# Patient Record
Sex: Female | Born: 1960 | Race: White | Hispanic: No | Marital: Single | Smoking: Never smoker
Health system: Southern US, Community
[De-identification: ages and names within clinical notes are randomized; demographics above are authoritative.]

## PROBLEM LIST (undated history)

## (undated) HISTORY — PX: BREAST BIOPSY: SHX20

---

## 1988-02-20 ENCOUNTER — Encounter: Payer: Self-pay | Admitting: Cardiology

## 1995-07-29 ENCOUNTER — Encounter: Payer: Self-pay | Admitting: Cardiology

## 1998-09-30 ENCOUNTER — Other Ambulatory Visit: Admission: RE | Admit: 1998-09-30 | Discharge: 1998-09-30 | Payer: Self-pay | Admitting: Obstetrics & Gynecology

## 1998-10-21 ENCOUNTER — Encounter: Payer: Self-pay | Admitting: Obstetrics & Gynecology

## 1998-10-21 ENCOUNTER — Ambulatory Visit (HOSPITAL_COMMUNITY): Admission: RE | Admit: 1998-10-21 | Discharge: 1998-10-21 | Payer: Self-pay | Admitting: Obstetrics & Gynecology

## 1998-11-04 ENCOUNTER — Encounter: Payer: Self-pay | Admitting: Obstetrics & Gynecology

## 1998-11-04 ENCOUNTER — Ambulatory Visit (HOSPITAL_COMMUNITY): Admission: RE | Admit: 1998-11-04 | Discharge: 1998-11-04 | Payer: Self-pay | Admitting: Obstetrics & Gynecology

## 1999-12-15 ENCOUNTER — Other Ambulatory Visit: Admission: RE | Admit: 1999-12-15 | Discharge: 1999-12-15 | Payer: Self-pay | Admitting: Obstetrics & Gynecology

## 2000-08-30 ENCOUNTER — Ambulatory Visit (HOSPITAL_COMMUNITY): Admission: RE | Admit: 2000-08-30 | Discharge: 2000-08-30 | Payer: Self-pay | Admitting: Obstetrics & Gynecology

## 2000-08-30 ENCOUNTER — Encounter: Payer: Self-pay | Admitting: Obstetrics & Gynecology

## 2003-01-08 ENCOUNTER — Other Ambulatory Visit: Admission: RE | Admit: 2003-01-08 | Discharge: 2003-01-08 | Payer: Self-pay | Admitting: Obstetrics & Gynecology

## 2005-07-26 ENCOUNTER — Other Ambulatory Visit: Admission: RE | Admit: 2005-07-26 | Discharge: 2005-07-26 | Payer: Self-pay | Admitting: Obstetrics & Gynecology

## 2007-06-12 ENCOUNTER — Encounter: Admission: RE | Admit: 2007-06-12 | Discharge: 2007-06-12 | Payer: Self-pay | Admitting: Internal Medicine

## 2009-10-14 ENCOUNTER — Emergency Department (HOSPITAL_COMMUNITY): Admission: EM | Admit: 2009-10-14 | Discharge: 2009-10-14 | Payer: Self-pay | Admitting: Emergency Medicine

## 2009-10-14 ENCOUNTER — Encounter: Payer: Self-pay | Admitting: Cardiology

## 2009-10-21 ENCOUNTER — Ambulatory Visit: Payer: Self-pay | Admitting: Internal Medicine

## 2009-11-04 ENCOUNTER — Ambulatory Visit: Payer: Self-pay | Admitting: Internal Medicine

## 2010-02-17 ENCOUNTER — Ambulatory Visit: Payer: Self-pay | Admitting: Internal Medicine

## 2010-08-31 ENCOUNTER — Ambulatory Visit: Payer: Self-pay | Admitting: Internal Medicine

## 2010-09-14 ENCOUNTER — Encounter: Payer: Self-pay | Admitting: Cardiology

## 2010-09-22 ENCOUNTER — Ambulatory Visit: Payer: Self-pay | Admitting: Internal Medicine

## 2010-09-25 ENCOUNTER — Encounter: Payer: Self-pay | Admitting: Cardiology

## 2010-10-11 ENCOUNTER — Encounter: Payer: Self-pay | Admitting: Cardiology

## 2010-10-11 ENCOUNTER — Ambulatory Visit: Payer: Self-pay | Admitting: Cardiology

## 2010-10-11 DIAGNOSIS — R002 Palpitations: Secondary | ICD-10-CM

## 2010-10-11 DIAGNOSIS — I499 Cardiac arrhythmia, unspecified: Secondary | ICD-10-CM | POA: Insufficient documentation

## 2010-10-27 ENCOUNTER — Ambulatory Visit: Payer: Self-pay

## 2010-10-27 ENCOUNTER — Ambulatory Visit (HOSPITAL_COMMUNITY)
Admission: RE | Admit: 2010-10-27 | Discharge: 2010-10-27 | Payer: Self-pay | Source: Home / Self Care | Attending: Cardiology | Admitting: Cardiology

## 2010-10-27 ENCOUNTER — Ambulatory Visit: Payer: Self-pay | Admitting: Cardiology

## 2010-10-27 ENCOUNTER — Encounter: Payer: Self-pay | Admitting: Cardiology

## 2010-10-30 ENCOUNTER — Encounter: Payer: Self-pay | Admitting: Cardiology

## 2010-11-03 ENCOUNTER — Ambulatory Visit: Payer: Self-pay | Admitting: Cardiology

## 2010-11-30 ENCOUNTER — Ambulatory Visit: Admit: 2010-11-30 | Payer: Self-pay | Admitting: Internal Medicine

## 2010-12-01 ENCOUNTER — Ambulatory Visit: Admit: 2010-12-01 | Payer: Self-pay | Admitting: Internal Medicine

## 2010-12-21 NOTE — Procedures (Signed)
Summary: SUMMARY REPORT  SUMMARY REPORT   Imported By: Mirna Mires 11/03/2010 14:36:51  _____________________________________________________________________  External Attachment:    Type:   Image     Comment:   External Document

## 2010-12-21 NOTE — Assessment & Plan Note (Signed)
Summary: PER CHECK OUT/ F/U TO MONITOR AND ECHO/SAF   Visit Type:  rov Primary Provider:  Dr. Lenord Fellers  CC:  no complaints.  History of Present Illness: 50 yo with history of HTN returns for evaluation of palpitations.  She says that her palpitations have mostly resolved.  She is exercising more, has lost weight, and has cut back on caffeine.  48 hour holter showed only rare PACs and echo showed a structurally normal heart.   ECG: NSR, rSR' pattern in V1 without significant conduction delay, poor anterior R wave progression (ECG in 11/10 was similar)  Labs (12/10): LDL 107, HDL 57, TSH normal  Current Medications (verified): 1)  Hydrochlorothiazide 25 Mg Tabs (Hydrochlorothiazide) .... Take One Daily 2)  Cozaar 50 Mg Tabs (Losartan Potassium) .... Take One Daily 3)  Zoloft 50 Mg Tabs (Sertraline Hcl) .... Take One Daily 4)  Biotin 1000 Mcg Tabs (Biotin) .... Take One Daily 5)  Daily-Vitamin  Tabs (Multiple Vitamin) .... Take One Daily 6)  Vitamin D3 5000 Unit/ml Liqd (Cholecalciferol) .Marland Kitchen.. 1 Tablet By Mouth Once Daily  Allergies (verified): No Known Drug Allergies  Past History:  Past Medical History: 1. HTN  2. Impaired fasting glucose 3. h/o positional vertigo 4. Anxiety 5. Palpitations: 48 hour holter (11/11) with occasional PACs only.  Echo (12/11): EF 60-65%, normal valves, normal RV.   Family History: Reviewed history from 10/11/2010 and no changes required. Grandfather with 3 MIs Grandfather with CHF  Social History: Reviewed history from 10/11/2010 and no changes required. Armed forces operational officer, former Engineer, production, nonsmoker  Vital Signs:  Patient profile:   50 year old female Height:      60.5 inches Weight:      158 pounds BMI:     30.46 Pulse (ortho):   88 / minute BP sitting:   114 / 79  (left arm) Cuff size:   regular  Vitals Entered By: Caralee Ates CMA (November 03, 2010 9:27 AM)  Physical Exam  General:  Well developed, well nourished,  in no acute distress. Neck:  Neck supple, no JVD. No masses, thyromegaly or abnormal cervical nodes. Lungs:  Clear bilaterally to auscultation and percussion. Heart:  Non-displaced PMI, chest non-tender; regular rate and rhythm, S1, S2 without murmurs, rubs. +S4. Carotid upstroke normal, no bruit. Pedals normal pulses. No edema, no varicosities. Abdomen:  Bowel sounds positive; abdomen soft and non-tender without masses, organomegaly, or hernias noted. No hepatosplenomegaly. Extremities:  No clubbing or cyanosis. Neurologic:  Alert and oriented x 3. Psych:  Normal affect.   Impression & Recommendations:  Problem # 1:  PALPITATIONS (ICD-785.1) Occasional PACs only on holter, no concerning arrhythmias.  Heart structurally normal on echo.  I suspect that her palpitations were due to PACs.  Symptoms are improved with decreased caffeine, increased exercise, and less anxiety while on Zoloft.  No further workup necessary.   Patient Instructions: 1)  Your physician recommends that you schedule a follow-up appointment as needed with Dr Shirlee Latch.

## 2010-12-21 NOTE — Assessment & Plan Note (Signed)
Summary: np6/. elevated heart rate. pt has bcbs. gd   Visit Type:  Initial Consult Primary Provider:  Dr. Lenord Fellers  CC:  concerns.  History of Present Illness: 50 yo with history of HTN presents for evaluation of palpitations.  She has been feeling irregular beats and fluttering in her chest starting around last December.  She feels them most at night but also when she gets "stressed."  Occasionally she gets episodes where her heart will race.  She gets very anxious when she has palpitations and has started on Zoloft, which really seems to have lessened the palpitations. She has good exercise tolerance.  She rides a stationary bike, uses an elliptical and treadmill with no exertional dyspnea or chest pain. She drinks 2 cups of coffee in the morning.    ECG: NSR, rSR' pattern in V1 without significant conduction delay, poor anterior R wave progression (ECG in 11/10 was similar)  Labs (12/10): LDL 107, HDL 57, TSH normal  Current Medications (verified): 1)  Hydrochlorothiazide 25 Mg Tabs (Hydrochlorothiazide) .... Take One Daily 2)  Cozaar 50 Mg Tabs (Losartan Potassium) .... Take One Daily 3)  Zoloft 50 Mg Tabs (Sertraline Hcl) .... Take One Daily 4)  Biotin 1000 Mcg Tabs (Biotin) .... Take One Daily 5)  Daily-Vitamin  Tabs (Multiple Vitamin) .... Take One Daily  Past History:  Past Medical History: 1. HTN  2. Impaired fasting glucose 3. h/o positional vertigo 4. Anxiety 5. Palpitations  Family History: Grandfather with 3 MIs Grandfather with CHF  Social History: Armed forces operational officer, former Engineer, production, nonsmoker  Review of Systems       All systems reviewed and negative except as per HPI.   Vital Signs:  Patient profile:   50 year old female Height:      60.5 inches Weight:      156 pounds BMI:     30.07 Pulse rate:   68 / minute Pulse rhythm:   regular BP sitting:   120 / 80  (right arm)  Vitals Entered By: Jacquelin Hawking, CMA (October 11, 2010 10:31  AM)  Physical Exam  General:  Well developed, well nourished, in no acute distress. Head:  normocephalic and atraumatic Nose:  no deformity, discharge, inflammation, or lesions Mouth:  Teeth, gums and palate normal. Oral mucosa normal. Neck:  Neck supple, no JVD. No masses, thyromegaly or abnormal cervical nodes. Lungs:  Clear bilaterally to auscultation and percussion. Heart:  Non-displaced PMI, chest non-tender; regular rate and rhythm, S1, S2 without murmurs, rubs. +S4. Carotid upstroke normal, no bruit. Pedals normal pulses. No edema, no varicosities. Abdomen:  Bowel sounds positive; abdomen soft and non-tender without masses, organomegaly, or hernias noted. No hepatosplenomegaly. Extremities:  No clubbing or cyanosis. Neurologic:  Alert and oriented x 3. Skin:  Intact without lesions or rashes. Psych:  Normal affect.   Impression & Recommendations:  Problem # 1:  PALPITATIONS (ICD-785.1) Patient has palpitations that likely represent PACs or PVCs.  She should cut back on caffeine.  Anxiety seems to be a significant driver for her palpitations, and they have considerably improved since starting on Zoloft.  She has good exercise tolerance.  I will get a 48 hour holter to confirm premature beats and an echo to confirm that her heart is structurally normal.  Her ECG is stable, and I do not think that it is significantly abnormal.   Other Orders: EKG w/ Interpretation (93000) Echocardiogram (Echo) Holter Monitor (Holter Monitor)  Patient Instructions: 1)  Your physician has  requested that you have an echocardiogram.  Echocardiography is a painless test that uses sound waves to create images of your heart. It provides your doctor with information about the size and shape of your heart and how well your heart's chambers and valves are working.  This procedure takes approximately one hour. There are no restrictions for this procedure. 2)  Your physician has recommended that you wear a  holter monitor.  Holter monitors are medical devices that record the heart's electrical activity. Doctors most often use these monitors to diagnose arrhythmias. Arrhythmias are problems with the speed or rhythm of the heartbeat. The monitor is a small, portable device. You can wear one while you do your normal daily activities. This is usually used to diagnose what is causing palpitations/syncope (passing out). 48 hour 3)  Your physician recommends that you schedule a follow-up appointment in: 2 weeks with Dr Shirlee Latch

## 2010-12-21 NOTE — Consult Note (Signed)
Summary: [facility] Medical Referral Form   [facility] Medical Referral Form   Imported By: Roderic Ovens 11/08/2010 16:10:18  _____________________________________________________________________  External Attachment:    Type:   Image     Comment:   External Document

## 2011-02-21 LAB — DIFFERENTIAL
Lymphocytes Relative: 18 % (ref 12–46)
Monocytes Absolute: 0.3 10*3/uL (ref 0.1–1.0)
Monocytes Relative: 4 % (ref 3–12)
Neutro Abs: 5.1 10*3/uL (ref 1.7–7.7)

## 2011-02-21 LAB — CBC
HCT: 42.9 % (ref 36.0–46.0)
Hemoglobin: 14.8 g/dL (ref 12.0–15.0)
MCHC: 34.5 g/dL (ref 30.0–36.0)
RBC: 5.03 MIL/uL (ref 3.87–5.11)

## 2011-02-21 LAB — BASIC METABOLIC PANEL
CO2: 26 mEq/L (ref 19–32)
Calcium: 9 mg/dL (ref 8.4–10.5)
GFR calc Af Amer: >60 mL/min (ref >60)
GFR calc non Af Amer: >60 mL/min (ref >60)
Potassium: 4 mEq/L (ref 3.5–5.1)
Sodium: 139 mEq/L (ref 135–145)

## 2011-02-21 LAB — POCT CARDIAC MARKERS
CKMB, poc: <1 ng/mL — ABNORMAL LOW (ref 1.0–8.0)
Troponin i, poc: <0.05 ng/mL (ref 0.00–0.09)

## 2011-03-08 ENCOUNTER — Ambulatory Visit: Payer: Self-pay | Admitting: Internal Medicine

## 2011-08-07 ENCOUNTER — Other Ambulatory Visit: Payer: Self-pay | Admitting: Internal Medicine

## 2011-08-08 ENCOUNTER — Other Ambulatory Visit: Payer: Self-pay

## 2011-08-08 MED ORDER — LOSARTAN POTASSIUM 50 MG PO TABS: 50.0000 mg | ORAL_TABLET | Freq: Every day | ORAL | Status: DC

## 2011-10-18 ENCOUNTER — Telehealth: Payer: Self-pay | Admitting: Internal Medicine

## 2011-10-18 MED ORDER — HYDROCHLOROTHIAZIDE 25 MG PO TABS: 25.0000 mg | ORAL_TABLET | Freq: Every day | ORAL | Status: DC

## 2011-10-18 NOTE — Telephone Encounter (Signed)
Check chart please re: no recent office visit. Can do 30 days and book OV

## 2011-10-18 NOTE — Telephone Encounter (Signed)
Called patient and left voicemail.  HCTZ 25 mg #30 phoned in with no refills.  Advised patient that she needs to schedule an appt with Dr. Lenord Fellers.  She has not been seen since January 2011

## 2011-10-31 ENCOUNTER — Other Ambulatory Visit: Payer: Self-pay | Admitting: Obstetrics & Gynecology

## 2011-10-31 DIAGNOSIS — N63 Unspecified lump in unspecified breast: Secondary | ICD-10-CM

## 2011-11-15 ENCOUNTER — Ambulatory Visit
Admission: RE | Admit: 2011-11-15 | Discharge: 2011-11-15 | Disposition: A | Discharge status: Home / Self Care | Payer: BC Managed Care – PPO | Source: Ambulatory Visit | Attending: Obstetrics & Gynecology | Admitting: Obstetrics & Gynecology

## 2011-11-15 DIAGNOSIS — N63 Unspecified lump in unspecified breast: Secondary | ICD-10-CM

## 2011-11-19 ENCOUNTER — Other Ambulatory Visit: Payer: Self-pay | Admitting: Internal Medicine

## 2012-01-11 ENCOUNTER — Ambulatory Visit (INDEPENDENT_AMBULATORY_CARE_PROVIDER_SITE_OTHER): Payer: BC Managed Care – PPO | Admitting: Internal Medicine

## 2012-01-11 ENCOUNTER — Encounter: Payer: Self-pay | Admitting: Internal Medicine

## 2012-01-11 VITALS — BP 114/84 | HR 80 | Temp 97.9°F | Ht 60.5 in | Wt 157.0 lb

## 2012-01-11 DIAGNOSIS — I10 Essential (primary) hypertension: Secondary | ICD-10-CM

## 2012-01-11 DIAGNOSIS — E119 Type 2 diabetes mellitus without complications: Secondary | ICD-10-CM

## 2012-01-11 DIAGNOSIS — Z23 Encounter for immunization: Secondary | ICD-10-CM

## 2012-01-11 DIAGNOSIS — F419 Anxiety disorder, unspecified: Secondary | ICD-10-CM

## 2012-01-11 DIAGNOSIS — Z0000 Encounter for general adult medical examination without abnormal findings: Secondary | ICD-10-CM

## 2012-01-11 DIAGNOSIS — R7302 Impaired glucose tolerance (oral): Secondary | ICD-10-CM

## 2012-01-11 LAB — POCT URINALYSIS DIPSTICK
Glucose, UA: NEGATIVE
Leukocytes, UA: NEGATIVE
Nitrite, UA: NEGATIVE
Urobilinogen, UA: NEGATIVE

## 2012-01-12 LAB — HEMOGLOBIN A1C: Hgb A1c MFr Bld: 5.6 % (ref <5.7)

## 2012-01-12 LAB — CBC WITH DIFFERENTIAL/PLATELET
Eosinophils Absolute: 0.1 10*3/uL (ref 0.0–0.7)
Lymphocytes Relative: 30 % (ref 12–46)
Lymphs Abs: 1.7 10*3/uL (ref 0.7–4.0)
Neutrophils Relative %: 62 % (ref 43–77)
Platelets: 300 10*3/uL (ref 150–400)
RBC: 5.33 MIL/uL — ABNORMAL HIGH (ref 3.87–5.11)
WBC: 5.8 10*3/uL (ref 4.0–10.5)

## 2012-01-12 LAB — LIPID PANEL
Cholesterol: 219 mg/dL — ABNORMAL HIGH (ref 0–200)
LDL Cholesterol: 138 mg/dL — ABNORMAL HIGH (ref 0–99)
VLDL: 21 mg/dL (ref 0–40)

## 2012-01-12 LAB — COMPREHENSIVE METABOLIC PANEL
ALT: 14 U/L (ref 0–35)
CO2: 27 mEq/L (ref 19–32)
Sodium: 142 mEq/L (ref 135–145)
Total Bilirubin: 0.4 mg/dL (ref 0.3–1.2)
Total Protein: 6.4 g/dL (ref 6.0–8.3)

## 2012-01-12 LAB — VITAMIN D 25 HYDROXY (VIT D DEFICIENCY, FRACTURES): Vit D, 25-Hydroxy: 46 ng/mL (ref 30–89)

## 2012-01-12 LAB — TSH: TSH: 2.059 u[IU]/mL (ref 0.350–4.500)

## 2012-02-01 ENCOUNTER — Other Ambulatory Visit: Payer: Self-pay | Admitting: Internal Medicine

## 2012-02-05 ENCOUNTER — Telehealth: Payer: Self-pay | Admitting: Internal Medicine

## 2012-02-05 MED ORDER — LOSARTAN POTASSIUM 50 MG PO TABS: 50.0000 mg | ORAL_TABLET | Freq: Every day | ORAL | Status: DC

## 2012-02-05 NOTE — Telephone Encounter (Signed)
Refill for Losartan sent to [facility]

## 2012-02-18 DIAGNOSIS — R7302 Impaired glucose tolerance (oral): Secondary | ICD-10-CM | POA: Insufficient documentation

## 2012-02-18 DIAGNOSIS — I10 Essential (primary) hypertension: Secondary | ICD-10-CM | POA: Insufficient documentation

## 2012-02-18 DIAGNOSIS — F419 Anxiety disorder, unspecified: Secondary | ICD-10-CM | POA: Insufficient documentation

## 2012-02-18 NOTE — Patient Instructions (Addendum)
Continue same medications and return in one year per patient request instead of 6 months

## 2012-02-18 NOTE — Progress Notes (Signed)
  Subjective:    Patient ID: Katherine Nelson, female    DOB: 02/03/1961, 51 y.o.   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 She is single and has never married. 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 Dr. Jennette Kettle is GYN physician and he  has her on estrogen replacement for menopause. History of hypertension and impaired glucose tolerance. Had vasovagal syncope 1996. Urinary tract infection 1999. Dr. Jennette Kettle does mammography. Took hepatitis B series in 1992. She has seen Dr. Juleen China in the past. He put her on Zoloft 50 mg daily and January 2012 for anxiety and stress at work. She saw Dr. Jearld Pies, cardiologist for evaluation for palpitations. He did a 2-D echocardiogram in 48 hour Holter monitor which did not reveal anything. She is on Cozaar 50 mg daily for hypertension. She also takes HCTZ 25 mg daily. Dr. Jearld Pies agree with his medications. She had an episode in 2012 for she had some haziness in her left eye centrally. 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 Patient does occasionally get headaches but doesn't describe them as migrainous. History of vitamin D deficiency.    Review of Systems history of anxiety, worries a lot about her health. Family history: Mom with history of lung cancer doing well.     Objective:   Physical Exam HEENT exam: Funduscopic exam is benign, TMs are clear, pharynx is clear, neck is supple without JVD thyromegaly or carotid bruits, chest clear to auscultation, breasts normal female without masses, cardiac exam regular rate and rhythm normal S1 and S2, abdomen no hepatosplenomegaly masses or tenderness, extremities without edema, pulses in feet are normal, no ulcers on feet        Assessment & Plan:  Hypertension  History of vitamin D deficiency  Anxiety  Impaired glucose tolerance  Plan: Return one year or as needed. Continue to watch diet. Try to lose a  bit of weight.

## 2012-12-05 ENCOUNTER — Other Ambulatory Visit: Payer: Self-pay | Admitting: Obstetrics & Gynecology

## 2012-12-05 DIAGNOSIS — Z1231 Encounter for screening mammogram for malignant neoplasm of breast: Secondary | ICD-10-CM

## 2012-12-25 ENCOUNTER — Ambulatory Visit
Admission: RE | Admit: 2012-12-25 | Discharge: 2012-12-25 | Disposition: A | Discharge status: Home / Self Care | Payer: BC Managed Care – PPO | Source: Ambulatory Visit | Attending: Obstetrics & Gynecology | Admitting: Obstetrics & Gynecology

## 2012-12-25 DIAGNOSIS — Z1231 Encounter for screening mammogram for malignant neoplasm of breast: Secondary | ICD-10-CM

## 2013-03-15 ENCOUNTER — Other Ambulatory Visit: Payer: Self-pay | Admitting: Internal Medicine

## 2013-03-15 NOTE — Telephone Encounter (Signed)
Refill refused - needs appt

## 2013-03-16 ENCOUNTER — Telehealth: Payer: Self-pay | Admitting: Internal Medicine

## 2013-03-16 MED ORDER — LOSARTAN POTASSIUM 50 MG PO TABS: 50.0000 mg | ORAL_TABLET | Freq: Every day | ORAL | Status: DC

## 2013-03-16 MED ORDER — HYDROCHLOROTHIAZIDE 25 MG PO TABS: ORAL_TABLET | ORAL | Status: DC

## 2013-03-16 NOTE — Telephone Encounter (Signed)
Patient says she has to have colonoscopy and GYN procedure done in the near future. And realizes she needs to come in for office visit/physical exam. We agreed that we will refill medication for 90 days. In the meantime she will call us to set up an appointment to be seen in the next couple of months. Given #90 of diuretic with no refill and #90 losartan 50 mg with no refill

## 2013-03-17 ENCOUNTER — Other Ambulatory Visit: Payer: Self-pay | Admitting: Internal Medicine

## 2013-03-19 NOTE — Telephone Encounter (Signed)
Left message for patient to call.

## 2013-06-19 ENCOUNTER — Other Ambulatory Visit: Payer: Self-pay | Admitting: Internal Medicine

## 2013-06-23 ENCOUNTER — Other Ambulatory Visit: Payer: Self-pay | Admitting: Internal Medicine

## 2013-06-23 NOTE — Telephone Encounter (Signed)
Has CPE appt Sept 19. Please refill for 30 days with one refill

## 2013-06-24 ENCOUNTER — Other Ambulatory Visit: Payer: Self-pay

## 2013-06-24 MED ORDER — LOSARTAN POTASSIUM 50 MG PO TABS: 50.0000 mg | ORAL_TABLET | Freq: Every day | ORAL | Status: DC

## 2013-06-24 MED ORDER — HYDROCHLOROTHIAZIDE 25 MG PO TABS: ORAL_TABLET | ORAL | Status: DC

## 2013-08-07 ENCOUNTER — Ambulatory Visit (INDEPENDENT_AMBULATORY_CARE_PROVIDER_SITE_OTHER): Payer: BC Managed Care – PPO | Admitting: Internal Medicine

## 2013-08-07 ENCOUNTER — Encounter: Payer: Self-pay | Admitting: Internal Medicine

## 2013-08-07 ENCOUNTER — Other Ambulatory Visit: Payer: BC Managed Care – PPO | Admitting: Internal Medicine

## 2013-08-07 VITALS — BP 116/74 | HR 80 | Ht 61.0 in | Wt 159.0 lb

## 2013-08-07 DIAGNOSIS — E7800 Pure hypercholesterolemia, unspecified: Secondary | ICD-10-CM

## 2013-08-07 DIAGNOSIS — Z13220 Encounter for screening for lipoid disorders: Secondary | ICD-10-CM

## 2013-08-07 DIAGNOSIS — Z130 Encounter for screening for diseases of the blood and blood-forming organs and certain disorders involving the immune mechanism: Secondary | ICD-10-CM

## 2013-08-07 DIAGNOSIS — R7309 Other abnormal glucose: Secondary | ICD-10-CM

## 2013-08-07 DIAGNOSIS — Z1329 Encounter for screening for other suspected endocrine disorder: Secondary | ICD-10-CM

## 2013-08-07 DIAGNOSIS — Z0000 Encounter for general adult medical examination without abnormal findings: Secondary | ICD-10-CM

## 2013-08-07 DIAGNOSIS — R7301 Impaired fasting glucose: Secondary | ICD-10-CM

## 2013-08-07 DIAGNOSIS — R7302 Impaired glucose tolerance (oral): Secondary | ICD-10-CM

## 2013-08-07 DIAGNOSIS — J309 Allergic rhinitis, unspecified: Secondary | ICD-10-CM

## 2013-08-07 DIAGNOSIS — I10 Essential (primary) hypertension: Secondary | ICD-10-CM

## 2013-08-07 LAB — POCT URINALYSIS DIPSTICK
Ketones, UA: NEGATIVE
Protein, UA: NEGATIVE
Spec Grav, UA: 1.02
pH, UA: 6

## 2013-08-07 LAB — CBC WITH DIFFERENTIAL/PLATELET
Basophils Absolute: 0 10*3/uL (ref 0.0–0.1)
Basophils Relative: 1 % (ref 0–1)
Eosinophils Absolute: 0.1 10*3/uL (ref 0.0–0.7)
MCH: 28.8 pg (ref 26.0–34.0)
MCHC: 34.6 g/dL (ref 30.0–36.0)
Neutrophils Relative %: 64 % (ref 43–77)
Platelets: 307 10*3/uL (ref 150–400)
RBC: 5.07 MIL/uL (ref 3.87–5.11)
RDW: 13.9 % (ref 11.5–15.5)

## 2013-08-07 LAB — COMPREHENSIVE METABOLIC PANEL
ALT: 15 U/L (ref 0–35)
Alkaline Phosphatase: 75 U/L (ref 39–117)
Creat: 0.67 mg/dL (ref 0.50–1.10)
Sodium: 138 mEq/L (ref 135–145)
Total Bilirubin: 0.6 mg/dL (ref 0.3–1.2)
Total Protein: 6.4 g/dL (ref 6.0–8.3)

## 2013-08-07 LAB — LIPID PANEL
LDL Cholesterol: 123 mg/dL — ABNORMAL HIGH (ref 0–99)
Total CHOL/HDL Ratio: 4.1 Ratio
VLDL: 25 mg/dL (ref 0–40)

## 2013-08-07 LAB — HEMOGLOBIN A1C
Hgb A1c MFr Bld: 5.8 % — ABNORMAL HIGH (ref <5.7)
Mean Plasma Glucose: 120 mg/dL — ABNORMAL HIGH (ref <117)

## 2013-08-07 LAB — VITAMIN D 25 HYDROXY (VIT D DEFICIENCY, FRACTURES): Vit D, 25-Hydroxy: 61 ng/mL (ref 30–89)

## 2013-08-08 ENCOUNTER — Encounter: Payer: Self-pay | Admitting: Internal Medicine

## 2013-08-08 DIAGNOSIS — J309 Allergic rhinitis, unspecified: Secondary | ICD-10-CM | POA: Insufficient documentation

## 2013-08-08 DIAGNOSIS — E7800 Pure hypercholesterolemia, unspecified: Secondary | ICD-10-CM | POA: Insufficient documentation

## 2013-08-08 NOTE — Patient Instructions (Addendum)
Continue to watch diet and exercise. Return in one year. Continue same antihypertensive medications.

## 2013-08-08 NOTE — Progress Notes (Signed)
Subjective:    Patient ID: Katherine Nelson, female    DOB: [birth year], 52 y.o.   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 GYN physician is Dr. Jennette Kettle. Patient just had flu vaccine done on September 17 through dental office.  She has a history of hypertension, anxiety, impaired glucose tolerance and palpitations. She is on losartan 50 mg daily and HCTZ. Does not like using Flonase nasal spray for allergic rhinitis symptoms. Coworkers have commented that she chronically sounds hoarse and congested. GYN has her on progesterone and Vivelle Dot. Currently not sexually active.  No known drug allergies  Last Pap smear done this year by Dr. Jennette Kettle.  Had vasovagal syncope 1996, UTI 1999. Mammography done at GYN office. Took hepatitis B series 1992. Has seen Dr. Dallas Schimke in the past. He placed her on Zoloft 50 mg daily for anxiety and stress at work and 2012. This in detail. She is now off of this medication. There is some stress at work. She also saw Dr. Sherlie Ban cardiologist for evaluation of palpitations. He did a 2-D echocardiogram, a 48 hour Holter monitor all of which were within normal limits. She takes Cozaar 50 mg daily and HCTZ 25 mg daily for hypertension. Cardiologist agreed with these medications.  She had an episode in 2012 for some type of haziness in her left centrally. 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 Patient does occasionally get headaches but doesn't describe them as migrainous. History of vitamin D deficiency.  She is a nonsmoker. Rare alcohol consumption. Single- never married and no children.  Family history: Mother with history of lung cancer and lung issues. Father with history of macular degeneration. One brother in good health.    Review of Systems  Constitutional: Negative.   HENT:       Complaint of hoarseness and nasal congestion  Eyes: Negative.    Respiratory: Negative.   Cardiovascular: Negative.   Gastrointestinal: Negative.   Endocrine: Negative.   Genitourinary: Negative.   Musculoskeletal: Negative.   Skin: Negative.   Allergic/Immunologic: Positive for environmental allergies.  Neurological: Positive for headaches.  Hematological: Negative.   Psychiatric/Behavioral:       History of anxiety       Objective:   Physical Exam  Vitals reviewed. Constitutional: She is oriented to person, place, and time. She appears well-developed and well-nourished. No distress.  HENT:  Head: Normocephalic and atraumatic.  Right Ear: External ear normal.  Left Ear: External ear normal.  Mouth/Throat: Oropharynx is clear and moist. No oropharyngeal exudate.  Very boggy nasal mucosa. Sounds hoarse  Eyes: Conjunctivae and EOM are normal. Pupils are equal, round, and reactive to light. Right eye exhibits no discharge. Left eye exhibits no discharge.  Neck: Neck supple. No JVD present.  Cardiovascular: Normal rate, regular rhythm, normal heart sounds and intact distal pulses.   No murmur heard. Pulmonary/Chest: Breath sounds normal. No respiratory distress. She has no wheezes. She has no rales. She exhibits no tenderness.  Breasts normal female  Abdominal: Soft. Bowel sounds are normal. She exhibits no distension and no mass. There is no tenderness. There is no rebound and no guarding.  Genitourinary:  Deferred to GYN  Musculoskeletal: Normal range of motion. She exhibits no edema.  Neurological: She is alert and oriented to person, place, and time. She has normal reflexes. No cranial nerve deficit.  Skin: Skin is warm and dry. No rash  noted. She is not diaphoretic.  Psychiatric: She has a normal mood and affect. Her behavior is normal. Judgment and thought content normal.          Assessment & Plan:  Controlled type 2 diabetes mellitus-stable with diet alone  Hypertension-stable on Cozaar and HCTZ  History of anxiety-does not  want to be on Zoloft chronically  Estrogen replacement therapy per GYN  Probable allergic rhinitis. Has never been allergy tested. Recommend allergy testing and further evaluation. Doesn't like taking Flonase, antihistamines make her drowsy. She may benefit from immunotherapy. Will make allergy appointment for her.  Plan: Return in one year or as needed. Continue to monitor Accu-Cheks regularly. She gets a fair amount of exercise. She's frustrated she is unable to lose weight.

## 2013-08-10 ENCOUNTER — Telehealth: Payer: Self-pay | Admitting: Internal Medicine

## 2013-08-10 NOTE — Telephone Encounter (Signed)
Notes faxed to Dr. Kathyrn Lass office @ [phone].

## 2013-08-25 ENCOUNTER — Other Ambulatory Visit: Payer: Self-pay | Admitting: Internal Medicine

## 2013-09-07 ENCOUNTER — Other Ambulatory Visit: Payer: Self-pay | Admitting: Internal Medicine

## 2014-02-12 ENCOUNTER — Other Ambulatory Visit: Payer: Self-pay

## 2014-02-12 DIAGNOSIS — Z1231 Encounter for screening mammogram for malignant neoplasm of breast: Secondary | ICD-10-CM

## 2014-03-05 ENCOUNTER — Ambulatory Visit
Admission: RE | Admit: 2014-03-05 | Discharge: 2014-03-05 | Disposition: A | Discharge status: Home / Self Care | Payer: BC Managed Care – PPO | Source: Ambulatory Visit

## 2014-03-05 DIAGNOSIS — Z1231 Encounter for screening mammogram for malignant neoplasm of breast: Secondary | ICD-10-CM

## 2014-03-25 ENCOUNTER — Other Ambulatory Visit: Payer: Self-pay | Admitting: Internal Medicine

## 2014-03-25 NOTE — Telephone Encounter (Signed)
Refill through Sept-needs PE Sept. Please call and book.

## 2014-08-27 ENCOUNTER — Other Ambulatory Visit: Payer: Self-pay | Admitting: Internal Medicine

## 2014-08-27 NOTE — Telephone Encounter (Signed)
Not seen since Sept 2014. Needs OV can refill x 30 days until seen but no more. Pt aware she needs to be seen yearly. We have had discussion previously.

## 2014-09-18 ENCOUNTER — Telehealth: Payer: Self-pay | Admitting: Internal Medicine

## 2014-09-18 MED ORDER — HYDROCHLOROTHIAZIDE 25 MG PO TABS: ORAL_TABLET | ORAL | Status: DC

## 2014-09-18 MED ORDER — LOSARTAN POTASSIUM 50 MG PO TABS: ORAL_TABLET | ORAL | Status: DC

## 2014-09-18 NOTE — Telephone Encounter (Signed)
Refill Losartan and HCTZ through December. Pt has appt for CPE in December.

## 2014-10-29 ENCOUNTER — Other Ambulatory Visit: Payer: BC Managed Care – PPO | Admitting: Internal Medicine

## 2014-10-29 DIAGNOSIS — Z0000 Encounter for general adult medical examination without abnormal findings: Secondary | ICD-10-CM

## 2014-10-29 DIAGNOSIS — R7309 Other abnormal glucose: Secondary | ICD-10-CM

## 2014-10-29 DIAGNOSIS — I10 Essential (primary) hypertension: Secondary | ICD-10-CM

## 2014-10-29 DIAGNOSIS — E7800 Pure hypercholesterolemia, unspecified: Secondary | ICD-10-CM

## 2014-10-29 LAB — TSH: TSH: 1.466 u[IU]/mL (ref 0.350–4.500)

## 2014-10-29 LAB — COMPREHENSIVE METABOLIC PANEL
ALBUMIN: 3.9 g/dL (ref 3.5–5.2)
ALT: 17 U/L (ref 0–35)
AST: 16 U/L (ref 0–37)
Alkaline Phosphatase: 70 U/L (ref 39–117)
BUN: 19 mg/dL (ref 6–23)
CHLORIDE: 102 meq/L (ref 96–112)
CO2: 26 mEq/L (ref 19–32)
Calcium: 9.1 mg/dL (ref 8.4–10.5)
Creat: 0.75 mg/dL (ref 0.50–1.10)
GLUCOSE: 89 mg/dL (ref 70–99)
POTASSIUM: 4 meq/L (ref 3.5–5.3)
Sodium: 139 mEq/L (ref 135–145)
Total Bilirubin: 0.4 mg/dL (ref 0.2–1.2)
Total Protein: 6.5 g/dL (ref 6.0–8.3)

## 2014-10-29 LAB — CBC WITH DIFFERENTIAL/PLATELET
Basophils Absolute: 0 10*3/uL (ref 0.0–0.1)
Basophils Relative: 0 % (ref 0–1)
Eosinophils Absolute: 0.1 10*3/uL (ref 0.0–0.7)
Eosinophils Relative: 2 % (ref 0–5)
HEMATOCRIT: 43.8 % (ref 36.0–46.0)
Hemoglobin: 15.2 g/dL — ABNORMAL HIGH (ref 12.0–15.0)
LYMPHS PCT: 24 % (ref 12–46)
Lymphs Abs: 1.6 10*3/uL (ref 0.7–4.0)
MCH: 28.7 pg (ref 26.0–34.0)
MCHC: 34.7 g/dL (ref 30.0–36.0)
MCV: 82.6 fL (ref 78.0–100.0)
MONO ABS: 0.4 10*3/uL (ref 0.1–1.0)
MONOS PCT: 6 % (ref 3–12)
MPV: 9.2 fL — ABNORMAL LOW (ref 9.4–12.4)
NEUTROS ABS: 4.4 10*3/uL (ref 1.7–7.7)
Neutrophils Relative %: 68 % (ref 43–77)
Platelets: 305 10*3/uL (ref 150–400)
RBC: 5.3 MIL/uL — AB (ref 3.87–5.11)
RDW: 14 % (ref 11.5–15.5)
WBC: 6.5 10*3/uL (ref 4.0–10.5)

## 2014-10-29 LAB — LIPID PANEL
Cholesterol: 225 mg/dL — ABNORMAL HIGH (ref 0–200)
HDL: 57 mg/dL (ref >39)
LDL Cholesterol: 142 mg/dL — ABNORMAL HIGH (ref 0–99)
TRIGLYCERIDES: 128 mg/dL (ref <150)
Total CHOL/HDL Ratio: 3.9 Ratio
VLDL: 26 mg/dL (ref 0–40)

## 2014-10-30 LAB — VITAMIN D 25 HYDROXY (VIT D DEFICIENCY, FRACTURES): Vit D, 25-Hydroxy: 32 ng/mL (ref 30–100)

## 2014-10-30 LAB — HEMOGLOBIN A1C
HEMOGLOBIN A1C: 5.8 % — AB (ref <5.7)
Mean Plasma Glucose: 120 mg/dL — ABNORMAL HIGH (ref <117)

## 2014-11-04 ENCOUNTER — Ambulatory Visit (INDEPENDENT_AMBULATORY_CARE_PROVIDER_SITE_OTHER): Payer: BC Managed Care – PPO | Admitting: Internal Medicine

## 2014-11-04 ENCOUNTER — Encounter: Payer: Self-pay | Admitting: Internal Medicine

## 2014-11-04 VITALS — BP 106/78 | HR 92 | Temp 98.4°F | Wt 167.0 lb

## 2014-11-04 DIAGNOSIS — I10 Essential (primary) hypertension: Secondary | ICD-10-CM | POA: Diagnosis not present

## 2014-11-04 DIAGNOSIS — Z0000 Encounter for general adult medical examination without abnormal findings: Secondary | ICD-10-CM

## 2014-11-04 DIAGNOSIS — E785 Hyperlipidemia, unspecified: Secondary | ICD-10-CM | POA: Diagnosis not present

## 2014-11-04 DIAGNOSIS — R7302 Impaired glucose tolerance (oral): Secondary | ICD-10-CM | POA: Diagnosis not present

## 2014-12-03 ENCOUNTER — Other Ambulatory Visit: Payer: Self-pay | Admitting: *Deleted

## 2014-12-03 MED ORDER — LOSARTAN POTASSIUM 50 MG PO TABS: ORAL_TABLET | ORAL | Status: DC

## 2014-12-03 MED ORDER — HYDROCHLOROTHIAZIDE 25 MG PO TABS: ORAL_TABLET | ORAL | Status: DC

## 2014-12-03 NOTE — Telephone Encounter (Signed)
Patient called requesting refill on HCTZ and Losartan [facility]

## 2015-02-16 ENCOUNTER — Encounter: Payer: Self-pay | Admitting: Internal Medicine

## 2015-02-16 NOTE — Patient Instructions (Addendum)
Continue same medications. Continue to monitor blood pressure at home. Call if blood pressure not well controlled. Return in 6 months for office visit, lipid panel and hemoglobin A1c. Recommend diet exercise and weight loss.

## 2015-02-16 NOTE — Progress Notes (Signed)
   Subjective:    Patient ID: Katherine Nelson, female    DOB: 09/12/1961, 54 y.o.   MRN: [id number]  HPI 54 year old White Female in today for health maintenance exam and evaluation of medical issues. History of hypertension. GYN physician is Dr. Jennette KettleNeal. Patient obtains flu vaccine for dental office. She has a history of impaired glucose tolerance and palpitations and anxiety. She is on losartan HCTZ. She is on estrogen replacement with Vivelle Dot and progesterone.  No known drug allergies  Vasovagal syncope 1996, UTI 1999. Mammography done through GYN office. Hepatitis B series taken in 1992. She took Zoloft in 2012 for anxiety and stress at work. She is seeing cardiologist for evaluation of palpitations. She has had a 2-D echocardiogram a 48 hour Holter monitor both of which were normal. She takes Cozaar 50 mg daily neck CTs a 25 mg daily for hypertension and cardiologist agreed with these medications.  Patient had an episode of some type of haziness in her left eye centrally and 2012. 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 She does get occasional headaches but doesn't describe them as migraine type headaches. History of vitamin D deficiency.  She is a nonsmoker. Rare alcohol consumption. Single, never married, no children.  Family history: Mother with history of lung cancer. Father with history of macular degeneration. One brother in good health.    Review of Systems  Constitutional: Negative.   All other systems reviewed and are negative.      Objective:   Physical Exam  Constitutional: She is oriented to person, place, and time. She appears well-developed and well-nourished. No distress.  HENT:  Head: Normocephalic and atraumatic.  Right Ear: External ear normal.  Left Ear: External ear normal.  Mouth/Throat: No oropharyngeal exudate.  Eyes: Conjunctivae and EOM are normal. Pupils are equal, round, and reactive to light. Right eye exhibits no discharge. Left eye exhibits no  discharge.  Neck: No JVD present. No thyromegaly present.  Cardiovascular: Normal rate, regular rhythm and normal heart sounds.   No murmur heard. Pulmonary/Chest: Effort normal and breath sounds normal. No respiratory distress. She has no wheezes. She has no rales.  Abdominal: Soft. Bowel sounds are normal. She exhibits no distension and no mass. There is no tenderness. There is no rebound and no guarding.  Genitourinary:  Deferred to GYN  Musculoskeletal: Normal range of motion. She exhibits no edema.  Neurological: She is alert and oriented to person, place, and time. She has normal reflexes. No cranial nerve deficit.  Skin: Skin is warm and dry. No rash noted. She is not diaphoretic.  Psychiatric: She has a normal mood and affect. Her behavior is normal. Judgment and thought content normal.  Vitals reviewed.         Assessment & Plan:  Essential hypertension  History of vitamin D deficiency

## 2015-05-27 ENCOUNTER — Encounter: Payer: Self-pay | Admitting: Internal Medicine

## 2015-05-27 ENCOUNTER — Ambulatory Visit (INDEPENDENT_AMBULATORY_CARE_PROVIDER_SITE_OTHER): Payer: BLUE CROSS/BLUE SHIELD | Admitting: Internal Medicine

## 2015-05-27 VITALS — BP 110/76 | HR 77 | Temp 98.4°F | Wt 173.0 lb

## 2015-05-27 DIAGNOSIS — E669 Obesity, unspecified: Secondary | ICD-10-CM | POA: Diagnosis not present

## 2015-05-27 DIAGNOSIS — R7302 Impaired glucose tolerance (oral): Secondary | ICD-10-CM

## 2015-05-27 DIAGNOSIS — I10 Essential (primary) hypertension: Secondary | ICD-10-CM

## 2015-05-27 DIAGNOSIS — E7800 Pure hypercholesterolemia, unspecified: Secondary | ICD-10-CM

## 2015-05-27 DIAGNOSIS — R7309 Other abnormal glucose: Secondary | ICD-10-CM

## 2015-05-27 LAB — LIPID PANEL
CHOLESTEROL: 211 mg/dL — AB (ref 0–200)
HDL: 47 mg/dL (ref >=46)
LDL Cholesterol: 136 mg/dL — ABNORMAL HIGH (ref 0–99)
TRIGLYCERIDES: 138 mg/dL (ref <150)
Total CHOL/HDL Ratio: 4.5 Ratio
VLDL: 28 mg/dL (ref 0–40)

## 2015-05-27 LAB — HEMOGLOBIN A1C
Hgb A1c MFr Bld: 5.9 % — ABNORMAL HIGH (ref <5.7)
MEAN PLASMA GLUCOSE: 123 mg/dL — AB (ref <117)

## 2015-05-27 MED ORDER — MOMETASONE FUROATE 50 MCG/ACT NA SUSP: 2.0000 | Freq: Every day | NASAL | Status: DC

## 2015-05-30 ENCOUNTER — Telehealth: Payer: Self-pay | Admitting: *Deleted

## 2015-05-30 NOTE — Telephone Encounter (Signed)
Patient prefers to wait before starting any medications . She states she fells she can control this with diet and exercise. She will call and schedule a follow up appt to recheck labs in 3 months

## 2015-05-30 NOTE — Telephone Encounter (Signed)
Please put note in book to call her in 3 months for appt.

## 2015-06-18 ENCOUNTER — Encounter: Payer: Self-pay | Admitting: Internal Medicine

## 2015-06-18 DIAGNOSIS — E669 Obesity, unspecified: Secondary | ICD-10-CM | POA: Insufficient documentation

## 2015-06-18 NOTE — Progress Notes (Signed)
   Subjective:    Patient ID: Katherine Nelson, female    DOB: 11/20/1960, 54 y.o.   MRN: [id number]  HPI   In today for six-month recheck on essential hypertension and hyperlipidemia. Blood pressure under excellent control on HCTZ 25 mg daily and losartan 50 mg daily. Hasn't lost of is much weight as she would like to. Finds it hard to find time to exercise.    Review of Systems     Objective:   Physical Exam  Neck is supple without JVD thyromegaly or carotid bruits. Chest clear to auscultation. Cardiac exam regular rate and rhythm normal S1 and S2. Extremities without edema. Fasting lipid panel pending      Assessment & Plan:  Essential hypertension-stable on low-dose losartan and diaphoretic  Hyperlipidemia-lipid panel pending  Obesity  Prediabetes-hemoglobin A1c 6.1%. Continue to monitor diet. Try to lose weight.  Plan: Review fasting lipid panel drawn today. Blood pressure under excellent control on current regimen. Return in 6 months for physical exam.  Addendum: Unfortunately total cholesterol is increased from 195-225. LDL cholesterol has increased from 123-142. Suggest patient start statin medication but she does not want to do that. Wants to reassess lipid panel in 6 months

## 2015-06-18 NOTE — Patient Instructions (Signed)
Patient refuses statin medication. Continue diet and exercise efforts. Recheck in 6 months.

## 2015-07-01 ENCOUNTER — Other Ambulatory Visit: Payer: Self-pay

## 2015-07-01 DIAGNOSIS — Z1231 Encounter for screening mammogram for malignant neoplasm of breast: Secondary | ICD-10-CM

## 2015-07-04 ENCOUNTER — Other Ambulatory Visit: Payer: Self-pay | Admitting: Internal Medicine

## 2015-07-07 ENCOUNTER — Other Ambulatory Visit: Payer: Self-pay | Admitting: *Deleted

## 2015-07-07 MED ORDER — LOSARTAN POTASSIUM 50 MG PO TABS: ORAL_TABLET | ORAL | Status: DC

## 2015-07-07 NOTE — Telephone Encounter (Signed)
Losartan refilled 

## 2015-08-11 ENCOUNTER — Other Ambulatory Visit: Payer: Self-pay

## 2015-08-11 ENCOUNTER — Ambulatory Visit
Admission: RE | Admit: 2015-08-11 | Discharge: 2015-08-11 | Disposition: A | Discharge status: Home / Self Care | Payer: BLUE CROSS/BLUE SHIELD | Source: Ambulatory Visit

## 2015-08-11 DIAGNOSIS — Z1231 Encounter for screening mammogram for malignant neoplasm of breast: Secondary | ICD-10-CM

## 2015-08-15 ENCOUNTER — Other Ambulatory Visit: Payer: Self-pay | Admitting: Obstetrics & Gynecology

## 2015-08-15 DIAGNOSIS — R928 Other abnormal and inconclusive findings on diagnostic imaging of breast: Secondary | ICD-10-CM

## 2015-08-18 ENCOUNTER — Other Ambulatory Visit: Payer: Self-pay | Admitting: Obstetrics & Gynecology

## 2015-08-18 ENCOUNTER — Ambulatory Visit
Admission: RE | Admit: 2015-08-18 | Discharge: 2015-08-18 | Disposition: A | Discharge status: Home / Self Care | Payer: BLUE CROSS/BLUE SHIELD | Source: Ambulatory Visit | Attending: Obstetrics & Gynecology | Admitting: Obstetrics & Gynecology

## 2015-08-18 DIAGNOSIS — R928 Other abnormal and inconclusive findings on diagnostic imaging of breast: Secondary | ICD-10-CM

## 2015-08-24 ENCOUNTER — Other Ambulatory Visit: Payer: Self-pay | Admitting: Obstetrics & Gynecology

## 2015-08-24 DIAGNOSIS — R928 Other abnormal and inconclusive findings on diagnostic imaging of breast: Secondary | ICD-10-CM

## 2015-08-29 ENCOUNTER — Ambulatory Visit
Admission: RE | Admit: 2015-08-29 | Discharge: 2015-08-29 | Disposition: A | Discharge status: Home / Self Care | Payer: BLUE CROSS/BLUE SHIELD | Source: Ambulatory Visit | Attending: Obstetrics & Gynecology | Admitting: Obstetrics & Gynecology

## 2015-08-29 DIAGNOSIS — R928 Other abnormal and inconclusive findings on diagnostic imaging of breast: Secondary | ICD-10-CM

## 2015-08-29 HISTORY — PX: BREAST BIOPSY: SHX20

## 2015-11-11 ENCOUNTER — Encounter: Payer: BC Managed Care – PPO | Admitting: Internal Medicine

## 2016-03-12 ENCOUNTER — Other Ambulatory Visit: Payer: Self-pay | Admitting: Internal Medicine

## 2016-03-12 MED ORDER — HYDROCHLOROTHIAZIDE 25 MG PO TABS: 25.0000 mg | ORAL_TABLET | Freq: Every day | ORAL | Status: DC

## 2016-03-12 NOTE — Telephone Encounter (Signed)
Needs lipid recheck-- last seen July. Book OV to check BP as well.

## 2016-03-12 NOTE — Telephone Encounter (Signed)
Ms. Katherine Nelson called saying she needs a refill of Hydrochlorothiazide sent to her pharmacy for one month. She has one pill left. Please call the pt if needed.  Pt's ph# [phone] Thank you.

## 2016-03-12 NOTE — Telephone Encounter (Signed)
Next appt 04/20/16 for CPE

## 2016-04-20 ENCOUNTER — Encounter: Payer: Self-pay | Admitting: Internal Medicine

## 2016-04-20 ENCOUNTER — Ambulatory Visit (INDEPENDENT_AMBULATORY_CARE_PROVIDER_SITE_OTHER): Payer: BLUE CROSS/BLUE SHIELD | Admitting: Internal Medicine

## 2016-04-20 VITALS — BP 118/74 | HR 75 | Temp 98.0°F | Resp 18 | Wt 162.5 lb

## 2016-04-20 DIAGNOSIS — Z5181 Encounter for therapeutic drug level monitoring: Secondary | ICD-10-CM | POA: Diagnosis not present

## 2016-04-20 DIAGNOSIS — Z130 Encounter for screening for diseases of the blood and blood-forming organs and certain disorders involving the immune mechanism: Secondary | ICD-10-CM | POA: Diagnosis not present

## 2016-04-20 DIAGNOSIS — Z0000 Encounter for general adult medical examination without abnormal findings: Secondary | ICD-10-CM | POA: Diagnosis not present

## 2016-04-20 DIAGNOSIS — Z79890 Hormone replacement therapy (postmenopausal): Secondary | ICD-10-CM

## 2016-04-20 DIAGNOSIS — I10 Essential (primary) hypertension: Secondary | ICD-10-CM

## 2016-04-20 DIAGNOSIS — R7302 Impaired glucose tolerance (oral): Secondary | ICD-10-CM

## 2016-04-20 DIAGNOSIS — Z1321 Encounter for screening for nutritional disorder: Secondary | ICD-10-CM

## 2016-04-20 DIAGNOSIS — E785 Hyperlipidemia, unspecified: Secondary | ICD-10-CM

## 2016-04-20 DIAGNOSIS — Z1329 Encounter for screening for other suspected endocrine disorder: Secondary | ICD-10-CM | POA: Diagnosis not present

## 2016-04-20 DIAGNOSIS — Z13220 Encounter for screening for lipoid disorders: Secondary | ICD-10-CM | POA: Diagnosis not present

## 2016-04-20 LAB — COMPLETE METABOLIC PANEL WITH GFR
ALT: 14 U/L (ref 6–29)
AST: 15 U/L (ref 10–35)
Albumin: 3.9 g/dL (ref 3.6–5.1)
Alkaline Phosphatase: 78 U/L (ref 33–130)
BUN: 17 mg/dL (ref 7–25)
CHLORIDE: 103 mmol/L (ref 98–110)
CO2: 28 mmol/L (ref 20–31)
CREATININE: 0.77 mg/dL (ref 0.50–1.05)
Calcium: 9.2 mg/dL (ref 8.6–10.4)
GFR, EST NON AFRICAN AMERICAN: 88 mL/min (ref >=60)
GFR, Est African American: >89 mL/min (ref >=60)
Glucose, Bld: 89 mg/dL (ref 65–99)
Potassium: 4.5 mmol/L (ref 3.5–5.3)
Sodium: 141 mmol/L (ref 135–146)
TOTAL PROTEIN: 6.6 g/dL (ref 6.1–8.1)
Total Bilirubin: 0.5 mg/dL (ref 0.2–1.2)

## 2016-04-20 LAB — CBC WITH DIFFERENTIAL/PLATELET
BASOS PCT: 1 %
Basophils Absolute: 62 cells/uL (ref 0–200)
EOS PCT: 3 %
Eosinophils Absolute: 186 cells/uL (ref 15–500)
HCT: 46 % — ABNORMAL HIGH (ref 35.0–45.0)
Hemoglobin: 15.6 g/dL — ABNORMAL HIGH (ref 11.7–15.5)
LYMPHS PCT: 30 %
Lymphs Abs: 1860 cells/uL (ref 850–3900)
MCH: 29.2 pg (ref 27.0–33.0)
MCHC: 33.9 g/dL (ref 32.0–36.0)
MCV: 86 fL (ref 80.0–100.0)
MONOS PCT: 5 %
MPV: 9.8 fL (ref 7.5–12.5)
Monocytes Absolute: 310 cells/uL (ref 200–950)
NEUTROS ABS: 3782 {cells}/uL (ref 1500–7800)
Neutrophils Relative %: 61 %
PLATELETS: 281 10*3/uL (ref 140–400)
RBC: 5.35 MIL/uL — ABNORMAL HIGH (ref 3.80–5.10)
RDW: 13.4 % (ref 11.0–15.0)
WBC: 6.2 10*3/uL (ref 3.8–10.8)

## 2016-04-20 LAB — POCT URINALYSIS DIPSTICK
BILIRUBIN UA: NEGATIVE
GLUCOSE UA: NEGATIVE
KETONES UA: NEGATIVE
Leukocytes, UA: NEGATIVE
Nitrite, UA: NEGATIVE
Protein, UA: NEGATIVE
RBC UA: NEGATIVE
SPEC GRAV UA: 1.01
Urobilinogen, UA: 0.2
pH, UA: 7

## 2016-04-20 LAB — HEMOGLOBIN A1C
Hgb A1c MFr Bld: 5.7 % — ABNORMAL HIGH (ref <5.7)
Mean Plasma Glucose: 117 mg/dL

## 2016-04-20 LAB — LIPID PANEL
Cholesterol: 220 mg/dL — ABNORMAL HIGH (ref 125–200)
HDL: 61 mg/dL (ref >=46)
LDL CALC: 136 mg/dL — AB (ref <130)
TRIGLYCERIDES: 113 mg/dL (ref <150)
Total CHOL/HDL Ratio: 3.6 Ratio (ref <=5.0)
VLDL: 23 mg/dL (ref <30)

## 2016-04-20 LAB — TSH: TSH: 1.84 mIU/L

## 2016-04-20 MED ORDER — HYDROCHLOROTHIAZIDE 25 MG PO TABS: 25.0000 mg | ORAL_TABLET | Freq: Every day | ORAL | Status: DC

## 2016-04-20 MED ORDER — LOSARTAN POTASSIUM 50 MG PO TABS: ORAL_TABLET | ORAL | Status: DC

## 2016-04-21 LAB — VITAMIN D 25 HYDROXY (VIT D DEFICIENCY, FRACTURES): Vit D, 25-Hydroxy: 33 ng/mL (ref 30–100)

## 2016-04-25 ENCOUNTER — Telehealth: Payer: Self-pay | Admitting: Internal Medicine

## 2016-04-25 NOTE — Telephone Encounter (Signed)
Left phone message detailing each lab result. Recommend continued diet and exercise and recheck in 6 months lipid and Hgb AIC with OV and BP

## 2016-05-14 NOTE — Progress Notes (Signed)
   Subjective:    Patient ID: Katherine Nelson, female    DOB: 08/14/1961, 55 y.o.   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 History of essential hypertension and impaired glucose tolerance.Remote history of palpitations. GYN physician is Dr. Jennette KettleNeal. She is on estrogen replacement.  No known drug allergies  Past medical history: Vasovagal syncope 1996, UTI 1999. Received Hepatitis B series in 1992. At one point started on Zoloft 50 mg daily for anxiety and stress at work in 2012. She is now off his medication.  She has seen Dr. Ronelle NighMaclean, cardiologist for evaluation of palpitations. He did a 2-D echocardiogram, 48 hour Holter monitor all of which were normal. She takes Cozaar and HCTZ for hypertension. Cardiologist agreed with these medications.  In 2012 she had some type of haziness in her left eye centrally. 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 She does occasionally get headaches but doesn't describe them as migrainous.  History of vitamin D deficiency.  She is a nonsmoker. Rare alcohol consumption. Single never married. Has no children.  Family history: Mother with history of lung cancer and lung issues. Father with history of macular degeneration. One brother in good health.    Review of Systems  Constitutional: Negative.   All other systems reviewed and are negative.      Objective:   Physical Exam  Constitutional: She is oriented to person, place, and time. She appears well-developed and well-nourished. No distress.  HENT:  Head: Normocephalic and atraumatic.  Right Ear: External ear normal.  Left Ear: External ear normal.  Mouth/Throat: Oropharynx is clear and moist. No oropharyngeal exudate.  Eyes: Conjunctivae are normal. Pupils are equal, round, and reactive to light. Right eye exhibits no discharge. Left eye exhibits no discharge.  Neck: Neck supple. No JVD present. No thyromegaly  present.  Cardiovascular: Normal rate, regular rhythm, normal heart sounds and intact distal pulses.   No murmur heard. Pulmonary/Chest: Effort normal and breath sounds normal. No respiratory distress. She has no wheezes.  Breasts normal female without masses  Abdominal: Soft. Bowel sounds are normal. She exhibits no distension. There is no tenderness. There is no rebound and no guarding.  Genitourinary:  Deferred to GYN  Musculoskeletal: She exhibits no edema.  Lymphadenopathy:    She has no cervical adenopathy.  Neurological: She is alert and oriented to person, place, and time. She has normal reflexes. No cranial nerve deficit. Coordination normal.  Skin: Skin is warm and dry. No rash noted. She is not diaphoretic.  Psychiatric: She has a normal mood and affect. Her behavior is normal. Judgment and thought content normal.  Vitals reviewed.         Assessment & Plan:  Essential hypertension  Impaired glucose tolerance  Plan: We'll call her with fasting lab results. Return in one year or as needed if lab work is stable. Continue diet and exercise regimen. She is frustrated that she is unable to lose weight.  Addendum: Her hemoglobin A1c is excellent at 5.7%. She continues to have issues with hyperlipidemia. Total cholesterol is 220 with an LDL cholesterol of 136. She does not want to be on statin medication. She will continue with diet and exercise regimen. She will continue on antihypertensive medication.

## 2016-05-16 NOTE — Patient Instructions (Signed)
It was a pleasure to see you today. Continue to monitor your blood pressure at home and keep a check on your glucose readings. Return in one year or as needed.

## 2016-08-24 ENCOUNTER — Other Ambulatory Visit: Payer: Self-pay | Admitting: Obstetrics & Gynecology

## 2016-08-24 DIAGNOSIS — Z1231 Encounter for screening mammogram for malignant neoplasm of breast: Secondary | ICD-10-CM

## 2016-09-13 ENCOUNTER — Ambulatory Visit
Admission: RE | Admit: 2016-09-13 | Discharge: 2016-09-13 | Disposition: A | Discharge status: Home / Self Care | Payer: BLUE CROSS/BLUE SHIELD | Source: Ambulatory Visit | Attending: Obstetrics & Gynecology | Admitting: Obstetrics & Gynecology

## 2016-09-13 DIAGNOSIS — Z1231 Encounter for screening mammogram for malignant neoplasm of breast: Secondary | ICD-10-CM

## 2016-10-26 ENCOUNTER — Ambulatory Visit (INDEPENDENT_AMBULATORY_CARE_PROVIDER_SITE_OTHER): Payer: BLUE CROSS/BLUE SHIELD | Admitting: Internal Medicine

## 2016-10-26 ENCOUNTER — Encounter: Payer: Self-pay | Admitting: Internal Medicine

## 2016-10-26 VITALS — BP 110/80 | HR 73 | Temp 98.0°F | Ht 61.0 in | Wt 163.0 lb

## 2016-10-26 DIAGNOSIS — R7302 Impaired glucose tolerance (oral): Secondary | ICD-10-CM

## 2016-10-26 DIAGNOSIS — I10 Essential (primary) hypertension: Secondary | ICD-10-CM | POA: Diagnosis not present

## 2016-10-26 DIAGNOSIS — Z23 Encounter for immunization: Secondary | ICD-10-CM | POA: Diagnosis not present

## 2016-10-26 NOTE — Patient Instructions (Addendum)
Lab work pending. RTC in 6 months. Continue diet exercise and weight loss efforts. It was a pleasure to see you today.

## 2016-10-26 NOTE — Progress Notes (Signed)
   Subjective:    Patient ID: Katherine Nelson, female    DOB: 05/25/1961, 55 y.o.   MRN: [id number]  HPI  Pt here for 6 month recheck on HTN. Has gained one pound since June. History of essential hypertension and impaired glucose tolerance.  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  Doing pretty well for the most part. No new complaints.  Says Dr. Toni ArthursFuller did some dental films recently and will be sending me a report about what she Soll. Apparently has some calcified areas she is concerned about.  Review of Systems see above     Objective:   Physical Exam  Skin warm and dry. Nodes none. Neck is supple without thyromegaly. Chest clear. Cardiac exam regular rate and rhythm normal S1 and S2. Extremities without edema.      Assessment & Plan:  Essential HTN-Stable on current regimen  Impaired glucose tolerance-hemoglobin A1c 5.5% which is excellent and was previously 5.7%  Plan: Continue same medications. Continue to work on diet exercise and weight loss. Return in 6 months.

## 2016-10-27 LAB — HEMOGLOBIN A1C
HEMOGLOBIN A1C: 5.5 % (ref <5.7)
MEAN PLASMA GLUCOSE: 111 mg/dL

## 2016-10-27 LAB — MICROALBUMIN, URINE: Microalb, Ur: 0.4 mg/dL

## 2016-10-29 ENCOUNTER — Encounter: Payer: Self-pay | Admitting: Internal Medicine

## 2016-11-05 ENCOUNTER — Telehealth: Payer: Self-pay | Admitting: Internal Medicine

## 2016-11-05 ENCOUNTER — Encounter: Payer: Self-pay | Admitting: Internal Medicine

## 2016-11-05 NOTE — Telephone Encounter (Signed)
Patient's dentist, Dr. Irene LimboSandra Fuller who is also her employer get a recent [employer] film on her and thought she saw some radio opacities that could be carotid calcifications or perhaps a stone in the salivary gland. Patient is asymptomatic for salivary duct stone. It's unlikely she has a significant carotid blockage at 55 years old. We will stress upon her the importance of keeping her cholesterol well controlled. I don't think we need to do a carotid Doppler at this time or send her to ENT for evaluation of a salivary gland problem that's really asymptomatic. I have left message for the patient expressing this opinion.

## 2017-04-06 ENCOUNTER — Other Ambulatory Visit: Payer: Self-pay | Admitting: Internal Medicine

## 2017-04-06 DIAGNOSIS — I10 Essential (primary) hypertension: Secondary | ICD-10-CM

## 2017-04-26 ENCOUNTER — Encounter: Payer: BLUE CROSS/BLUE SHIELD | Admitting: Internal Medicine

## 2017-05-31 ENCOUNTER — Other Ambulatory Visit: Payer: PRIVATE HEALTH INSURANCE | Admitting: Internal Medicine

## 2017-05-31 ENCOUNTER — Encounter: Payer: Self-pay | Admitting: Internal Medicine

## 2017-05-31 ENCOUNTER — Ambulatory Visit (INDEPENDENT_AMBULATORY_CARE_PROVIDER_SITE_OTHER): Payer: PRIVATE HEALTH INSURANCE | Admitting: Internal Medicine

## 2017-05-31 VITALS — BP 104/70 | HR 75 | Temp 98.1°F | Ht 60.5 in | Wt 173.0 lb

## 2017-05-31 DIAGNOSIS — R7302 Impaired glucose tolerance (oral): Secondary | ICD-10-CM

## 2017-05-31 DIAGNOSIS — Z0000 Encounter for general adult medical examination without abnormal findings: Secondary | ICD-10-CM

## 2017-05-31 DIAGNOSIS — Z1321 Encounter for screening for nutritional disorder: Secondary | ICD-10-CM

## 2017-05-31 DIAGNOSIS — E7800 Pure hypercholesterolemia, unspecified: Secondary | ICD-10-CM

## 2017-05-31 DIAGNOSIS — Z6833 Body mass index (BMI) 33.0-33.9, adult: Secondary | ICD-10-CM | POA: Diagnosis not present

## 2017-05-31 DIAGNOSIS — E8881 Metabolic syndrome: Secondary | ICD-10-CM | POA: Diagnosis not present

## 2017-05-31 DIAGNOSIS — Z1329 Encounter for screening for other suspected endocrine disorder: Secondary | ICD-10-CM

## 2017-05-31 DIAGNOSIS — I10 Essential (primary) hypertension: Secondary | ICD-10-CM | POA: Diagnosis not present

## 2017-05-31 DIAGNOSIS — E669 Obesity, unspecified: Secondary | ICD-10-CM

## 2017-05-31 LAB — POCT URINALYSIS DIPSTICK
BILIRUBIN UA: NEGATIVE
GLUCOSE UA: NEGATIVE
KETONES UA: NEGATIVE
Leukocytes, UA: NEGATIVE
NITRITE UA: NEGATIVE
PH UA: 5 (ref 5.0–8.0)
Protein, UA: NEGATIVE
RBC UA: NEGATIVE
SPEC GRAV UA: 1.02 (ref 1.010–1.025)
Urobilinogen, UA: 0.2 E.U./dL

## 2017-05-31 LAB — CBC WITH DIFFERENTIAL/PLATELET
BASOS PCT: 0 %
Basophils Absolute: 0 cells/uL (ref 0–200)
EOS ABS: 180 {cells}/uL (ref 15–500)
Eosinophils Relative: 3 %
HEMATOCRIT: 45.9 % — AB (ref 35.0–45.0)
HEMOGLOBIN: 15.5 g/dL (ref 11.7–15.5)
LYMPHS ABS: 1740 {cells}/uL (ref 850–3900)
LYMPHS PCT: 29 %
MCH: 29.5 pg (ref 27.0–33.0)
MCHC: 33.8 g/dL (ref 32.0–36.0)
MCV: 87.4 fL (ref 80.0–100.0)
MONO ABS: 300 {cells}/uL (ref 200–950)
MPV: 9.5 fL (ref 7.5–12.5)
Monocytes Relative: 5 %
NEUTROS PCT: 63 %
Neutro Abs: 3780 cells/uL (ref 1500–7800)
Platelets: 284 10*3/uL (ref 140–400)
RBC: 5.25 MIL/uL — ABNORMAL HIGH (ref 3.80–5.10)
RDW: 13.4 % (ref 11.0–15.0)
WBC: 6 10*3/uL (ref 3.8–10.8)

## 2017-05-31 LAB — TSH: TSH: 1.38 m[IU]/L

## 2017-05-31 MED ORDER — OLOPATADINE HCL 0.1 % OP SOLN: 1.0000 [drp] | Freq: Two times a day (BID) | OPHTHALMIC | 12 refills | Status: DC

## 2017-05-31 MED ORDER — FLUTICASONE PROPIONATE 50 MCG/ACT NA SUSP: 2.0000 | Freq: Every day | NASAL | 99 refills | Status: DC

## 2017-06-01 LAB — HEMOGLOBIN A1C
HEMOGLOBIN A1C: 5.6 % (ref <5.7)
MEAN PLASMA GLUCOSE: 114 mg/dL

## 2017-06-01 LAB — COMPLETE METABOLIC PANEL WITH GFR
ALBUMIN: 4 g/dL (ref 3.6–5.1)
ALK PHOS: 79 U/L (ref 33–130)
ALT: 24 U/L (ref 6–29)
AST: 20 U/L (ref 10–35)
BUN: 21 mg/dL (ref 7–25)
CALCIUM: 9.3 mg/dL (ref 8.6–10.4)
CHLORIDE: 101 mmol/L (ref 98–110)
CO2: 23 mmol/L (ref 20–31)
CREATININE: 0.8 mg/dL (ref 0.50–1.05)
GFR, Est Non African American: 83 mL/min (ref >=60)
Glucose, Bld: 100 mg/dL — ABNORMAL HIGH (ref 65–99)
Potassium: 4.2 mmol/L (ref 3.5–5.3)
Sodium: 137 mmol/L (ref 135–146)
Total Bilirubin: 0.5 mg/dL (ref 0.2–1.2)
Total Protein: 6.5 g/dL (ref 6.1–8.1)

## 2017-06-01 LAB — LIPID PANEL
Cholesterol: 240 mg/dL — ABNORMAL HIGH (ref <200)
HDL: 51 mg/dL (ref >50)
LDL CALC: 159 mg/dL — AB (ref <100)
Total CHOL/HDL Ratio: 4.7 Ratio (ref <5.0)
Triglycerides: 150 mg/dL — ABNORMAL HIGH (ref <150)
VLDL: 30 mg/dL (ref <30)

## 2017-06-01 LAB — MICROALBUMIN / CREATININE URINE RATIO
CREATININE, URINE: 149 mg/dL (ref 20–320)
MICROALB UR: 0.8 mg/dL
MICROALB/CREAT RATIO: 5 ug/mg{creat} (ref <30)

## 2017-06-01 LAB — VITAMIN D 25 HYDROXY (VIT D DEFICIENCY, FRACTURES): VIT D 25 HYDROXY: 39 ng/mL (ref 30–100)

## 2017-06-16 NOTE — Progress Notes (Signed)
   Subjective:    Patient ID: Katherine Nelson, female    DOB: 04/25/1961, 56 y.o.   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 She has a history of essential hypertension and impaired glucose tolerance. Remote history of palpitations. She has GYN physician, Dr. Jennette KettleNeal.  No known drug allergies.  Past medical history: Vasovagal syncope 1996, UTI 1999. Receive the hepatitis B series in 1992. At one point started Zoloft for anxiety and stress at work in 2012. She is now off this medication.  She has seen Dr. Shirlee LatchMcLean, cardiologist for evaluation of palpitations. He did a 2-D echocardiogram and a 48 hour Holter monitor all of which were normal. She takes Cozaar and HCTZ for hypertension. Cardiologist agreed with these medications.  In 2012, she had some type of haziness in her left eye centrally. 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 She occasionally gets headaches but doesn't describe them as migraines.  History of vitamin D deficiency  She is a nonsmoker. Rare alcohol consumption. Single, never married. No children. 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  Family history: Mother with history of lung cancer and lung issues. Father with history of macular degeneration. One brother in good health.       Review of Systems  Constitutional: Negative.   All other systems reviewed and are negative.       Objective:   Physical Exam  Constitutional: She is oriented to person, place, and time. She appears well-developed and well-nourished. No distress.  HENT:  Head: Normocephalic and atraumatic.  Right Ear: External ear normal.  Left Ear: External ear normal.  Mouth/Throat: Oropharynx is clear and moist. No oropharyngeal exudate.  Eyes: Pupils are equal, round, and reactive to light. Conjunctivae and EOM are normal. Right eye exhibits no discharge. Left eye exhibits no discharge. No scleral icterus.  Neck:  Neck supple. No JVD present. No thyromegaly present.  Cardiovascular: Normal rate, regular rhythm and normal heart sounds.   No murmur heard. Pulmonary/Chest: Effort normal and breath sounds normal. No respiratory distress. She has no wheezes.  Breasts normal female without masses  Abdominal: Soft. Bowel sounds are normal. She exhibits no distension. There is no tenderness. There is no rebound and no guarding.  Genitourinary:  Genitourinary Comments: Deferred to GYN  Musculoskeletal: She exhibits no edema.  Lymphadenopathy:    She has no cervical adenopathy.  Neurological: She is alert and oriented to person, place, and time. She has normal reflexes. No cranial nerve deficit. Coordination normal.  Skin: Skin is warm and dry. No rash noted. She is not diaphoretic.  Psychiatric: She has a normal mood and affect. Her behavior is normal. Judgment and thought content normal.  Vitals reviewed.         Assessment & Plan:  Hypertension-blood pressure stable on current regimen  Hyperlipidemia-spoke with her about starting Crestor 5 mg daily. She would like to try diet and exercise.  Metabolic syndrome  Obesity-BMI 33  Impaired glucose tolerance-hemoglobin A1c is normal and she is not on medication. She's been exercising. Would like to exercise more but has long days at work.  Plan: Repeat lipid panel in 6 months with office visit.

## 2017-06-16 NOTE — Patient Instructions (Signed)
It was a pleasure to see you today. Please watch diet and lites recheck lipid panel in 6 months with office visit. Continue diet exercise and weight loss regimen.

## 2017-07-18 ENCOUNTER — Other Ambulatory Visit: Payer: Self-pay | Admitting: Internal Medicine

## 2017-07-18 DIAGNOSIS — I10 Essential (primary) hypertension: Secondary | ICD-10-CM

## 2017-10-18 ENCOUNTER — Other Ambulatory Visit: Payer: Self-pay | Admitting: Obstetrics & Gynecology

## 2017-10-18 DIAGNOSIS — Z139 Encounter for screening, unspecified: Secondary | ICD-10-CM

## 2017-11-01 ENCOUNTER — Other Ambulatory Visit: Payer: Self-pay | Admitting: Internal Medicine

## 2017-11-01 DIAGNOSIS — I10 Essential (primary) hypertension: Secondary | ICD-10-CM

## 2017-11-01 NOTE — Telephone Encounter (Signed)
Patient calling to request refill on her Losartan.  She just had her CPE in July 2018.    She is requesting #[phone] please.  Pharmacy:  [facility] in [place].  Thank you.

## 2017-11-01 NOTE — Telephone Encounter (Signed)
Please call in 90 with no refill

## 2017-11-01 NOTE — Telephone Encounter (Signed)
Please call in 90 with no refill 

## 2017-11-21 ENCOUNTER — Ambulatory Visit
Admission: RE | Admit: 2017-11-21 | Discharge: 2017-11-21 | Disposition: A | Discharge status: Home / Self Care | Payer: PRIVATE HEALTH INSURANCE | Source: Ambulatory Visit | Attending: Obstetrics & Gynecology | Admitting: Obstetrics & Gynecology

## 2017-11-21 DIAGNOSIS — Z139 Encounter for screening, unspecified: Secondary | ICD-10-CM

## 2018-02-07 ENCOUNTER — Other Ambulatory Visit: Payer: Self-pay | Admitting: Internal Medicine

## 2018-02-07 DIAGNOSIS — I10 Essential (primary) hypertension: Secondary | ICD-10-CM

## 2018-05-09 ENCOUNTER — Other Ambulatory Visit: Payer: Self-pay

## 2018-05-09 DIAGNOSIS — I10 Essential (primary) hypertension: Secondary | ICD-10-CM

## 2018-05-09 MED ORDER — LOSARTAN POTASSIUM 50 MG PO TABS: 50.0000 mg | ORAL_TABLET | Freq: Every day | ORAL | 0 refills | Status: DC

## 2018-07-08 ENCOUNTER — Other Ambulatory Visit: Payer: Self-pay | Admitting: Internal Medicine

## 2018-07-08 DIAGNOSIS — R7302 Impaired glucose tolerance (oral): Secondary | ICD-10-CM

## 2018-07-08 DIAGNOSIS — E7800 Pure hypercholesterolemia, unspecified: Secondary | ICD-10-CM

## 2018-07-08 DIAGNOSIS — Z0000 Encounter for general adult medical examination without abnormal findings: Secondary | ICD-10-CM

## 2018-07-08 DIAGNOSIS — I10 Essential (primary) hypertension: Secondary | ICD-10-CM

## 2018-07-08 DIAGNOSIS — E8881 Metabolic syndrome: Secondary | ICD-10-CM

## 2018-07-11 ENCOUNTER — Encounter: Payer: Self-pay | Admitting: Internal Medicine

## 2018-07-11 ENCOUNTER — Other Ambulatory Visit (HOSPITAL_COMMUNITY)
Admission: RE | Admit: 2018-07-11 | Discharge: 2018-07-11 | Disposition: A | Discharge status: Home / Self Care | Payer: PRIVATE HEALTH INSURANCE | Source: Ambulatory Visit | Attending: Internal Medicine | Admitting: Internal Medicine

## 2018-07-11 ENCOUNTER — Ambulatory Visit (INDEPENDENT_AMBULATORY_CARE_PROVIDER_SITE_OTHER): Payer: PRIVATE HEALTH INSURANCE | Admitting: Internal Medicine

## 2018-07-11 VITALS — BP 120/70 | HR 74 | Ht 61.0 in | Wt 176.0 lb

## 2018-07-11 DIAGNOSIS — E8881 Metabolic syndrome: Secondary | ICD-10-CM

## 2018-07-11 DIAGNOSIS — I10 Essential (primary) hypertension: Secondary | ICD-10-CM

## 2018-07-11 DIAGNOSIS — Z0000 Encounter for general adult medical examination without abnormal findings: Secondary | ICD-10-CM

## 2018-07-11 DIAGNOSIS — Z124 Encounter for screening for malignant neoplasm of cervix: Secondary | ICD-10-CM

## 2018-07-11 DIAGNOSIS — R8290 Unspecified abnormal findings in urine: Secondary | ICD-10-CM | POA: Diagnosis not present

## 2018-07-11 DIAGNOSIS — E782 Mixed hyperlipidemia: Secondary | ICD-10-CM

## 2018-07-11 DIAGNOSIS — R7302 Impaired glucose tolerance (oral): Secondary | ICD-10-CM

## 2018-07-11 DIAGNOSIS — Z6833 Body mass index (BMI) 33.0-33.9, adult: Secondary | ICD-10-CM

## 2018-07-11 LAB — POCT URINALYSIS DIPSTICK
Bilirubin, UA: NEGATIVE
Blood, UA: NEGATIVE
GLUCOSE UA: NEGATIVE
Ketones, UA: NEGATIVE
NITRITE UA: NEGATIVE
PROTEIN UA: NEGATIVE
SPEC GRAV UA: 1.015 (ref 1.010–1.025)
Urobilinogen, UA: 0.2 E.U./dL
pH, UA: 6.5 (ref 5.0–8.0)

## 2018-07-11 MED ORDER — TRIAMCINOLONE ACETONIDE 0.1 % EX CREA: TOPICAL_CREAM | CUTANEOUS | 1 refills | Status: DC

## 2018-07-11 MED ORDER — FUROSEMIDE 20 MG PO TABS: 20.0000 mg | ORAL_TABLET | Freq: Every day | ORAL | 0 refills | Status: DC

## 2018-07-11 NOTE — Progress Notes (Signed)
   Subjective:    Patient ID: Katherine Nelson, female    DOB: 09/03/1961, 57 y.o.   MRN: [id number]  HPI 57 year old female in today for health maintenance exam and evaluation of medical issues.  History of essential hypertension and impaired glucose tolerance.  Remote history of palpitations.  No known drug allergies.  She is seeing Dr. Sherlie BanMcClain cardiologist for evaluation of palpitations.  He did 2D echocardiogram and 48 Holter monitor all of which was normal.  She takes Cozaar and HCTZ for hypertension and cardiologist agreed with these medications.  Vasovagal syncope 1996, UTI 1999, received the hepatitis B series in 1992.  At one point started Zoloft for anxiety and stress at work in 2012 but no longer takes his medicine.  In 2012 she had some type of haziness in her left eye centrally.  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  She occasionally gets headaches but does not describe them as migraines.  History of vitamin D deficiency.  Social history: Non-smoker.  Rare alcohol consumption.  Single.  Never married.  No children.  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  Father with history of macular degeneration.  One brother in good health.  Weight was 162 in 2016. Talked about diet and exercise.   Review of Systems  Constitutional: Negative.   All other systems reviewed and are negative.      Objective:   Physical Exam  Constitutional: She is oriented to person, place, and time. She appears well-developed and well-nourished.  HENT:  Head: Normocephalic and atraumatic.  Right Ear: External ear normal.  Left Ear: External ear normal.  Nose: Nose normal.  Mouth/Throat: Oropharynx is clear and moist.  Eyes: Pupils are equal, round, and reactive to light. Conjunctivae are normal. Right eye exhibits no discharge. Left eye exhibits no discharge. No scleral icterus.  Neck: No JVD present. No thyromegaly  present.  Cardiovascular: Normal rate, regular rhythm, normal heart sounds and intact distal pulses.  No murmur heard. Pulmonary/Chest: Effort normal and breath sounds normal. No stridor. No respiratory distress. She has no wheezes. She has no rales.  Breasts normal female without masses  Abdominal: Soft. Bowel sounds are normal. She exhibits no distension and no mass. There is no tenderness. There is no guarding.  Genitourinary:  Genitourinary Comments: Pap taken.  Bimanual normal.  Musculoskeletal: She exhibits no edema.  Lymphadenopathy:    She has no cervical adenopathy.  Neurological: She is alert and oriented to person, place, and time. She displays normal reflexes. No cranial nerve deficit. Coordination normal.  Skin: Skin is warm and dry.  Psychiatric: She has a normal mood and affect. Her behavior is normal. Judgment and thought content normal.  Vitals reviewed.         Assessment & Plan:  Normal health maintenance exam  Impaired glucose tolerance  Mixed hyperlipidemia-total cholesterol 237 and triglycerides 257 with LDL of 145  Hemoglobin A1c 6%  Dependent edema  Plan: Start Crestor 5 mg daily and follow-up in 3 months.  Suggested Dr. Franc[facility] for weight loss.  Continue losartan for hypertension.  Having issues with dependent edema and will be started on Lasix 20 mg daily.  Follow-up with the med on September 12.

## 2018-07-12 LAB — CBC WITH DIFFERENTIAL/PLATELET
Basophils Absolute: 52 cells/uL (ref 0–200)
Basophils Relative: 0.8 %
EOS PCT: 2.5 %
Eosinophils Absolute: 163 cells/uL (ref 15–500)
HEMATOCRIT: 46.3 % — AB (ref 35.0–45.0)
Hemoglobin: 15.4 g/dL (ref 11.7–15.5)
LYMPHS ABS: 1671 {cells}/uL (ref 850–3900)
MCH: 27.7 pg (ref 27.0–33.0)
MCHC: 33.3 g/dL (ref 32.0–36.0)
MCV: 83.4 fL (ref 80.0–100.0)
MPV: 10.3 fL (ref 7.5–12.5)
Monocytes Relative: 5.1 %
NEUTROS ABS: 4284 {cells}/uL (ref 1500–7800)
Neutrophils Relative %: 65.9 %
Platelets: 319 10*3/uL (ref 140–400)
RBC: 5.55 10*6/uL — AB (ref 3.80–5.10)
RDW: 13.3 % (ref 11.0–15.0)
Total Lymphocyte: 25.7 %
WBC: 6.5 10*3/uL (ref 3.8–10.8)
WBCMIX: 332 {cells}/uL (ref 200–950)

## 2018-07-12 LAB — COMPLETE METABOLIC PANEL WITH GFR
AG Ratio: 1.6 (calc) (ref 1.0–2.5)
ALBUMIN MSPROF: 4.2 g/dL (ref 3.6–5.1)
ALKALINE PHOSPHATASE (APISO): 95 U/L (ref 33–130)
ALT: 18 U/L (ref 6–29)
AST: 20 U/L (ref 10–35)
BILIRUBIN TOTAL: 0.5 mg/dL (ref 0.2–1.2)
BUN: 16 mg/dL (ref 7–25)
CHLORIDE: 102 mmol/L (ref 98–110)
CO2: 25 mmol/L (ref 20–32)
CREATININE: 0.77 mg/dL (ref 0.50–1.05)
Calcium: 9.4 mg/dL (ref 8.6–10.4)
GFR, Est African American: 99 mL/min/{1.73_m2} (ref >=60)
GFR, Est Non African American: 86 mL/min/{1.73_m2} (ref >=60)
GLUCOSE: 98 mg/dL (ref 65–99)
Globulin: 2.7 g/dL (calc) (ref 1.9–3.7)
Potassium: 3.8 mmol/L (ref 3.5–5.3)
Sodium: 139 mmol/L (ref 135–146)
Total Protein: 6.9 g/dL (ref 6.1–8.1)

## 2018-07-12 LAB — URINE CULTURE
MICRO NUMBER: 91010798
SPECIMEN QUALITY:: ADEQUATE

## 2018-07-12 LAB — TSH: TSH: 2.01 m[IU]/L (ref 0.40–4.50)

## 2018-07-12 LAB — LIPID PANEL
Cholesterol: 237 mg/dL — ABNORMAL HIGH (ref <200)
HDL: 52 mg/dL (ref >50)
LDL CHOLESTEROL (CALC): 145 mg/dL — AB
NON-HDL CHOLESTEROL (CALC): 185 mg/dL — AB (ref <130)
Total CHOL/HDL Ratio: 4.6 (calc) (ref <5.0)
Triglycerides: 257 mg/dL — ABNORMAL HIGH (ref <150)

## 2018-07-12 LAB — HEMOGLOBIN A1C
Hgb A1c MFr Bld: 6 % of total Hgb — ABNORMAL HIGH (ref <5.7)
MEAN PLASMA GLUCOSE: 126 (calc)
eAG (mmol/L): 7 (calc)

## 2018-07-12 LAB — MICROALBUMIN / CREATININE URINE RATIO
CREATININE, URINE: 79 mg/dL (ref 20–275)
Microalb Creat Ratio: 3 mcg/mg creat (ref <30)
Microalb, Ur: 0.2 mg/dL

## 2018-07-12 LAB — VITAMIN D 25 HYDROXY (VIT D DEFICIENCY, FRACTURES): VIT D 25 HYDROXY: 36 ng/mL (ref 30–100)

## 2018-07-13 ENCOUNTER — Encounter: Payer: Self-pay | Admitting: Internal Medicine

## 2018-07-13 ENCOUNTER — Telehealth: Payer: Self-pay | Admitting: Internal Medicine

## 2018-07-13 MED ORDER — ROSUVASTATIN CALCIUM 5 MG PO TABS: 5.0000 mg | ORAL_TABLET | Freq: Every day | ORAL | 3 refills | Status: DC

## 2018-07-13 NOTE — Telephone Encounter (Signed)
Left message for pt with results. Asked her to consider metformin 500 mg daily. Have prescribed Crestor 5 mg daily with follow up in 3 months will need lipid liver AIC at that time

## 2018-07-15 LAB — CYTOLOGY - PAP: Diagnosis: NEGATIVE

## 2018-07-19 NOTE — Patient Instructions (Addendum)
Start Lasix 20 mg daily for dependent edema.  Start Crestor for hyperlipidemia.  Follow-up September 12 with the met for diuretic monitoring.

## 2018-07-31 ENCOUNTER — Other Ambulatory Visit (INDEPENDENT_AMBULATORY_CARE_PROVIDER_SITE_OTHER): Payer: PRIVATE HEALTH INSURANCE | Admitting: Internal Medicine

## 2018-07-31 DIAGNOSIS — Z5181 Encounter for therapeutic drug level monitoring: Secondary | ICD-10-CM

## 2018-07-31 DIAGNOSIS — Z79899 Other long term (current) drug therapy: Secondary | ICD-10-CM

## 2018-07-31 DIAGNOSIS — I10 Essential (primary) hypertension: Secondary | ICD-10-CM

## 2018-08-01 LAB — BASIC METABOLIC PANEL
BUN: 18 mg/dL (ref 7–25)
CALCIUM: 9.5 mg/dL (ref 8.6–10.4)
CHLORIDE: 104 mmol/L (ref 98–110)
CO2: 26 mmol/L (ref 20–32)
Creat: 0.88 mg/dL (ref 0.50–1.05)
Glucose, Bld: 92 mg/dL (ref 65–99)
Potassium: 3.8 mmol/L (ref 3.5–5.3)
Sodium: 140 mmol/L (ref 135–146)

## 2018-08-14 ENCOUNTER — Other Ambulatory Visit: Payer: Self-pay | Admitting: Internal Medicine

## 2018-12-21 ENCOUNTER — Other Ambulatory Visit: Payer: Self-pay | Admitting: Internal Medicine

## 2019-03-16 ENCOUNTER — Other Ambulatory Visit: Payer: Self-pay

## 2019-03-16 DIAGNOSIS — I10 Essential (primary) hypertension: Secondary | ICD-10-CM

## 2019-03-16 MED ORDER — LOSARTAN POTASSIUM 50 MG PO TABS: 50.0000 mg | ORAL_TABLET | Freq: Every day | ORAL | 0 refills | Status: DC

## 2019-04-17 ENCOUNTER — Other Ambulatory Visit: Payer: Self-pay | Admitting: Internal Medicine

## 2019-06-05 ENCOUNTER — Other Ambulatory Visit: Payer: Self-pay

## 2019-06-05 ENCOUNTER — Other Ambulatory Visit: Payer: PRIVATE HEALTH INSURANCE | Admitting: Internal Medicine

## 2019-06-05 DIAGNOSIS — Z79899 Other long term (current) drug therapy: Secondary | ICD-10-CM

## 2019-06-05 DIAGNOSIS — R7302 Impaired glucose tolerance (oral): Secondary | ICD-10-CM

## 2019-06-05 DIAGNOSIS — Z5181 Encounter for therapeutic drug level monitoring: Secondary | ICD-10-CM

## 2019-06-05 DIAGNOSIS — E785 Hyperlipidemia, unspecified: Secondary | ICD-10-CM

## 2019-06-05 DIAGNOSIS — I10 Essential (primary) hypertension: Secondary | ICD-10-CM

## 2019-06-06 LAB — HEPATIC FUNCTION PANEL
AG Ratio: 1.5 (calc) (ref 1.0–2.5)
ALT: 17 U/L (ref 6–29)
AST: 18 U/L (ref 10–35)
Albumin: 3.8 g/dL (ref 3.6–5.1)
Alkaline phosphatase (APISO): 88 U/L (ref 37–153)
Bilirubin, Direct: 0.1 mg/dL (ref 0.0–0.2)
Globulin: 2.5 g/dL (calc) (ref 1.9–3.7)
Indirect Bilirubin: 0.3 mg/dL (calc) (ref 0.2–1.2)
Total Bilirubin: 0.4 mg/dL (ref 0.2–1.2)
Total Protein: 6.3 g/dL (ref 6.1–8.1)

## 2019-06-06 LAB — LIPID PANEL
Cholesterol: 171 mg/dL (ref <200)
HDL: 46 mg/dL — ABNORMAL LOW (ref >=50)
LDL Cholesterol (Calc): 98 mg/dL (calc)
Non-HDL Cholesterol (Calc): 125 mg/dL (calc) (ref <130)
Total CHOL/HDL Ratio: 3.7 (calc) (ref <5.0)
Triglycerides: 178 mg/dL — ABNORMAL HIGH (ref <150)

## 2019-06-06 LAB — BASIC METABOLIC PANEL
BUN: 19 mg/dL (ref 7–25)
CO2: 26 mmol/L (ref 20–32)
Calcium: 9.1 mg/dL (ref 8.6–10.4)
Chloride: 107 mmol/L (ref 98–110)
Creat: 0.79 mg/dL (ref 0.50–1.05)
Glucose, Bld: 96 mg/dL (ref 65–99)
Potassium: 4.3 mmol/L (ref 3.5–5.3)
Sodium: 140 mmol/L (ref 135–146)

## 2019-06-06 LAB — HEMOGLOBIN A1C
Hgb A1c MFr Bld: 5.8 % of total Hgb — ABNORMAL HIGH (ref <5.7)
Mean Plasma Glucose: 120 (calc)
eAG (mmol/L): 6.6 (calc)

## 2019-06-12 ENCOUNTER — Other Ambulatory Visit: Payer: Self-pay

## 2019-06-12 ENCOUNTER — Encounter: Payer: Self-pay | Admitting: Internal Medicine

## 2019-06-12 ENCOUNTER — Ambulatory Visit (INDEPENDENT_AMBULATORY_CARE_PROVIDER_SITE_OTHER): Payer: PRIVATE HEALTH INSURANCE | Admitting: Internal Medicine

## 2019-06-12 VITALS — BP 102/70 | HR 84 | Temp 98.3°F | Ht 61.0 in | Wt 176.0 lb

## 2019-06-12 DIAGNOSIS — Z6833 Body mass index (BMI) 33.0-33.9, adult: Secondary | ICD-10-CM

## 2019-06-12 DIAGNOSIS — R7302 Impaired glucose tolerance (oral): Secondary | ICD-10-CM

## 2019-06-12 DIAGNOSIS — E782 Mixed hyperlipidemia: Secondary | ICD-10-CM

## 2019-06-12 DIAGNOSIS — E8881 Metabolic syndrome: Secondary | ICD-10-CM | POA: Diagnosis not present

## 2019-06-12 DIAGNOSIS — I10 Essential (primary) hypertension: Secondary | ICD-10-CM | POA: Diagnosis not present

## 2019-06-12 DIAGNOSIS — F439 Reaction to severe stress, unspecified: Secondary | ICD-10-CM

## 2019-06-12 MED ORDER — SERTRALINE HCL 50 MG PO TABS: 50.0000 mg | ORAL_TABLET | Freq: Every day | ORAL | 3 refills | Status: DC

## 2019-06-12 MED ORDER — ALPRAZOLAM 0.25 MG PO TABS: 0.2500 mg | ORAL_TABLET | Freq: Two times a day (BID) | ORAL | 1 refills | Status: DC | PRN

## 2019-06-12 NOTE — Progress Notes (Signed)
   Subjective:    Patient ID: Katherine Nelson, female    DOB: 10-Jan-1961, 58 y.o.   MRN: [id number]  HPI Pleasant 58 year old Female for follow-up on hypertension and hyperlipidemia.  She has a history of impaired glucose tolerance.  Hemoglobin A1c excellent at 5.8%.  Triglycerides have improved from 257 in August 2019 to 178.  LDL cholesterol has improved from 145 in August 2019 to 98.  Total cholesterol decreased 237 to 171.  Overall feels well.  Has situational stress at work which was discussed at length.    Review of Systems     Objective:   Physical Exam Blood pressure 102/70.  Pulse 84.  Weight 176 pounds.  BMI 33.27.  Neck supple no thyromegaly.  Chest clear to auscultation.  Cardiac exam regular rate and rhythm normal S1 and S2 without murmurs or gallops.  Extremities with trace edema.  Neuro no focal deficits.       Assessment & Plan:  LE edema-continue Lasix 20 mg daily  Hypertension under excellent control-continue Cozaar  Situational stress -continue Zoloft 50 mg daily  Hyperlipidemia-treated with Crestor 5 mg daily and stable  Anxiety-given Xanax 0.25 mg to take twice daily as needed for situational stress and anxiety

## 2019-06-28 ENCOUNTER — Other Ambulatory Visit: Payer: Self-pay | Admitting: Internal Medicine

## 2019-06-28 DIAGNOSIS — I10 Essential (primary) hypertension: Secondary | ICD-10-CM

## 2019-07-18 ENCOUNTER — Encounter: Payer: Self-pay | Admitting: Internal Medicine

## 2019-07-18 NOTE — Patient Instructions (Addendum)
It was a pleasure to see you today.  Labs are stable.  Continue current medications.  Physical exam booked for November 2020.  Have annual flu vaccine.

## 2019-08-07 ENCOUNTER — Other Ambulatory Visit: Payer: Self-pay | Admitting: Internal Medicine

## 2019-08-07 DIAGNOSIS — Z1231 Encounter for screening mammogram for malignant neoplasm of breast: Secondary | ICD-10-CM

## 2019-08-28 ENCOUNTER — Other Ambulatory Visit: Payer: Self-pay | Admitting: Internal Medicine

## 2019-09-23 ENCOUNTER — Other Ambulatory Visit: Payer: Self-pay | Admitting: *Deleted

## 2019-09-23 DIAGNOSIS — Z20822 Contact with and (suspected) exposure to covid-19: Secondary | ICD-10-CM

## 2019-09-24 ENCOUNTER — Ambulatory Visit: Payer: PRIVATE HEALTH INSURANCE

## 2019-09-24 LAB — NOVEL CORONAVIRUS, NAA: SARS-CoV-2, NAA: NOT DETECTED

## 2019-09-25 ENCOUNTER — Other Ambulatory Visit: Payer: Self-pay

## 2019-09-25 ENCOUNTER — Other Ambulatory Visit: Payer: PRIVATE HEALTH INSURANCE | Admitting: Internal Medicine

## 2019-09-25 DIAGNOSIS — F439 Reaction to severe stress, unspecified: Secondary | ICD-10-CM

## 2019-09-25 DIAGNOSIS — Z1329 Encounter for screening for other suspected endocrine disorder: Secondary | ICD-10-CM

## 2019-09-25 DIAGNOSIS — E782 Mixed hyperlipidemia: Secondary | ICD-10-CM

## 2019-09-25 DIAGNOSIS — I10 Essential (primary) hypertension: Secondary | ICD-10-CM

## 2019-09-25 DIAGNOSIS — E8881 Metabolic syndrome: Secondary | ICD-10-CM

## 2019-09-25 DIAGNOSIS — Z1321 Encounter for screening for nutritional disorder: Secondary | ICD-10-CM

## 2019-09-25 DIAGNOSIS — R7302 Impaired glucose tolerance (oral): Secondary | ICD-10-CM

## 2019-10-02 ENCOUNTER — Other Ambulatory Visit: Payer: Self-pay

## 2019-10-02 ENCOUNTER — Ambulatory Visit (INDEPENDENT_AMBULATORY_CARE_PROVIDER_SITE_OTHER): Payer: PRIVATE HEALTH INSURANCE | Admitting: Internal Medicine

## 2019-10-02 ENCOUNTER — Encounter: Payer: Self-pay | Admitting: Internal Medicine

## 2019-10-02 VITALS — BP 102/70 | HR 94 | Ht 61.0 in | Wt 178.0 lb

## 2019-10-02 DIAGNOSIS — Z0000 Encounter for general adult medical examination without abnormal findings: Secondary | ICD-10-CM

## 2019-10-02 LAB — POCT URINALYSIS DIPSTICK
Appearance: NEGATIVE
Bilirubin, UA: NEGATIVE
Blood, UA: NEGATIVE
Glucose, UA: NEGATIVE
Ketones, UA: NEGATIVE
Leukocytes, UA: NEGATIVE
Nitrite, UA: NEGATIVE
Odor: NEGATIVE
Protein, UA: NEGATIVE
Spec Grav, UA: 1.01 (ref 1.010–1.025)
Urobilinogen, UA: 0.2 E.U./dL
pH, UA: 6.5 (ref 5.0–8.0)

## 2019-10-02 MED ORDER — CLOTRIMAZOLE-BETAMETHASONE 1-0.05 % EX CREA: 1.0000 "application " | TOPICAL_CREAM | Freq: Two times a day (BID) | CUTANEOUS | 0 refills | Status: DC

## 2019-10-21 ENCOUNTER — Other Ambulatory Visit: Payer: Self-pay | Admitting: Internal Medicine

## 2019-11-06 ENCOUNTER — Other Ambulatory Visit: Payer: Self-pay | Admitting: Internal Medicine

## 2019-11-06 DIAGNOSIS — I10 Essential (primary) hypertension: Secondary | ICD-10-CM

## 2019-11-18 NOTE — Progress Notes (Signed)
   Subjective:    Patient ID: Katherine Nelson, female    DOB: 07-19-1961, 58 y.o.   MRN: [id number]  HPI 58 year old Female seen today for follow-up of multiple medical issues.  Her mother died recently of complications of cancer.  Her father is now residing alone and may have to come live with her.  She is concerned about this.  She had a physical examination in August 2019.  Initially we had intended to do health maintenance exam today but she is concerned that her insurance may not pay for that.  Lab studies were not done either due to cost.  She needs to check with her insurance company as to what they will cover in the near future.  She has a history of hypertension and dependent edema.  History of anxiety treated with Xanax.  A year ago total cholesterol was 237, triglycerides 257 and LDL cholesterol 145.  She was placed on Crestor 5 mg daily in a few months back total cholesterol was 171, HDL cholesterol 46 and triglycerides 178.    Review of Systems see above some issues with sleep but has Xanax on hand.     Objective:   Physical Exam Blood pressure 102/70, pulse 94, weight 178 pounds, BMI 33.63 skin warm and dry.  Nodes none.  TMs and pharynx are clear.  Neck is supple.  Chest clear.  Cardiac exam regular rate and rhythm.  No pitting lower extremity edema.  Affect is a bit sad but that is understandable given recent loss of her mother.  Neuro intact without focal deficits.  Affect thought and judgment are normal.       Assessment & Plan:  Grief reaction-she is strong and religious and will be able to cope with this loss.  Essential hypertension-stable on current regimen  History of anxiety treated with Xanax  Hyperlipidemia treated with Crestor  Dependent edema treated with diuretic  Plan: Continue current medications.  We will follow-up with her in the Spring 2021.  She will need fasting labs at that time.

## 2019-11-18 NOTE — Patient Instructions (Signed)
We are sorry to hear about your mother's passing.  Please call if we can do anything for you.  Continue current medications and follow-up in the spring 2021.

## 2019-11-19 ENCOUNTER — Other Ambulatory Visit: Payer: Self-pay | Admitting: Internal Medicine

## 2019-11-26 ENCOUNTER — Other Ambulatory Visit: Payer: Self-pay

## 2019-11-26 ENCOUNTER — Ambulatory Visit
Admission: RE | Admit: 2019-11-26 | Discharge: 2019-11-26 | Disposition: A | Discharge status: Home / Self Care | Payer: PRIVATE HEALTH INSURANCE | Source: Ambulatory Visit | Attending: Internal Medicine | Admitting: Internal Medicine

## 2019-11-26 DIAGNOSIS — Z1231 Encounter for screening mammogram for malignant neoplasm of breast: Secondary | ICD-10-CM

## 2019-12-01 ENCOUNTER — Other Ambulatory Visit: Payer: Self-pay | Admitting: Internal Medicine

## 2019-12-01 DIAGNOSIS — R928 Other abnormal and inconclusive findings on diagnostic imaging of breast: Secondary | ICD-10-CM

## 2019-12-16 ENCOUNTER — Other Ambulatory Visit: Payer: Self-pay

## 2019-12-16 ENCOUNTER — Ambulatory Visit
Admission: RE | Admit: 2019-12-16 | Discharge: 2019-12-16 | Disposition: A | Discharge status: Home / Self Care | Payer: PRIVATE HEALTH INSURANCE | Source: Ambulatory Visit | Attending: Internal Medicine | Admitting: Internal Medicine

## 2019-12-16 ENCOUNTER — Other Ambulatory Visit: Payer: Self-pay | Admitting: Internal Medicine

## 2019-12-16 DIAGNOSIS — R928 Other abnormal and inconclusive findings on diagnostic imaging of breast: Secondary | ICD-10-CM

## 2019-12-16 DIAGNOSIS — N6489 Other specified disorders of breast: Secondary | ICD-10-CM

## 2020-01-01 ENCOUNTER — Ambulatory Visit
Admission: RE | Admit: 2020-01-01 | Discharge: 2020-01-01 | Disposition: A | Discharge status: Home / Self Care | Payer: PRIVATE HEALTH INSURANCE | Source: Ambulatory Visit | Attending: Internal Medicine | Admitting: Internal Medicine

## 2020-01-01 ENCOUNTER — Other Ambulatory Visit: Payer: Self-pay

## 2020-01-01 DIAGNOSIS — N6489 Other specified disorders of breast: Secondary | ICD-10-CM

## 2020-01-01 HISTORY — PX: BREAST BIOPSY: SHX20

## 2020-03-25 ENCOUNTER — Other Ambulatory Visit: Payer: PRIVATE HEALTH INSURANCE | Admitting: Internal Medicine

## 2020-03-25 ENCOUNTER — Other Ambulatory Visit: Payer: Self-pay

## 2020-03-25 DIAGNOSIS — R7302 Impaired glucose tolerance (oral): Secondary | ICD-10-CM

## 2020-03-25 DIAGNOSIS — E785 Hyperlipidemia, unspecified: Secondary | ICD-10-CM

## 2020-03-26 LAB — LIPID PANEL
Cholesterol: 167 mg/dL (ref <200)
HDL: 53 mg/dL (ref >=50)
LDL Cholesterol (Calc): 88 mg/dL (calc)
Non-HDL Cholesterol (Calc): 114 mg/dL (calc) (ref <130)
Total CHOL/HDL Ratio: 3.2 (calc) (ref <5.0)
Triglycerides: 159 mg/dL — ABNORMAL HIGH (ref <150)

## 2020-03-26 LAB — HEMOGLOBIN A1C
Hgb A1c MFr Bld: 5.8 % of total Hgb — ABNORMAL HIGH (ref <5.7)
Mean Plasma Glucose: 120 (calc)
eAG (mmol/L): 6.6 (calc)

## 2020-04-01 ENCOUNTER — Ambulatory Visit (INDEPENDENT_AMBULATORY_CARE_PROVIDER_SITE_OTHER): Payer: PRIVATE HEALTH INSURANCE | Admitting: Internal Medicine

## 2020-04-01 ENCOUNTER — Encounter: Payer: Self-pay | Admitting: Internal Medicine

## 2020-04-01 ENCOUNTER — Other Ambulatory Visit: Payer: Self-pay

## 2020-04-01 VITALS — BP 110/80 | HR 72 | Temp 97.8°F | Ht 61.0 in | Wt 184.0 lb

## 2020-04-01 DIAGNOSIS — F329 Major depressive disorder, single episode, unspecified: Secondary | ICD-10-CM

## 2020-04-01 DIAGNOSIS — Z6834 Body mass index (BMI) 34.0-34.9, adult: Secondary | ICD-10-CM

## 2020-04-01 DIAGNOSIS — R7302 Impaired glucose tolerance (oral): Secondary | ICD-10-CM

## 2020-04-01 DIAGNOSIS — E782 Mixed hyperlipidemia: Secondary | ICD-10-CM | POA: Diagnosis not present

## 2020-04-01 DIAGNOSIS — N6012 Diffuse cystic mastopathy of left breast: Secondary | ICD-10-CM

## 2020-04-01 DIAGNOSIS — I10 Essential (primary) hypertension: Secondary | ICD-10-CM | POA: Diagnosis not present

## 2020-04-01 DIAGNOSIS — F419 Anxiety disorder, unspecified: Secondary | ICD-10-CM

## 2020-04-01 DIAGNOSIS — E8881 Metabolic syndrome: Secondary | ICD-10-CM | POA: Diagnosis not present

## 2020-04-01 NOTE — Progress Notes (Signed)
   Subjective:    Patient ID: Katherine Nelson, female    DOB: June 25, 1961, 59 y.o.   MRN: [id number]  HPI 59 year old Female for 6 month recheck.Doing well with no new complaints.  She was last seen 10-27-2019.  Her mother had passed away from complications of cancer.  Patient has a history of hypertension and hyperlipidemia.  History of anxiety treated with Xanax.  At last visit weight was 178 pounds and BMI of 33.63.  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  Had needle biopsy left breast which was benign in February 2021.  Recent lipid panel shows improvement in triglycerides from 257 a year ago to 159.  Total cholesterol has improved from 237 a year ago to 167.  LDL has improved from 145 a year ago to 88.  Hemoglobin A1c excellent at 5.8%.  Has been as high as 6% in 2019.    Review of Systems no new complaints     Objective:   Physical Exam Blood pressure 110/80 pulse 72 temperature 97.8 degrees.  Pulse oximetry 97% weight 184 pounds.  BMI 34.77.  Has gained 6 pounds in the past 6 months.  She will work on diet exercise and weight loss.   Neck is supple without JVD thyromegaly or carotid bruits.  No thyromegaly.  Chest clear to auscultation.  Cardiac exam regular rate and rhythm normal S1 and S2 without murmurs or gallops.  No lower extremity edema.  Affect thought and judgment are normal.       Assessment & Plan:  Significant improvement in hyperlipidemia on Crestor.  Continue diet and exercise efforts and continue Crestor 5 mg daily.  Essential hypertension-stable on losartan and Lasix  Dependent edema-stable on Lasix  Anxiety/ depression treated with Xanax and Zoloft and stable  BMI 34.77-work on diet and exercise.  Plan: Continue current medications and follow-up with physical examination in 6 months.

## 2020-04-05 ENCOUNTER — Other Ambulatory Visit: Payer: Self-pay | Admitting: Internal Medicine

## 2020-04-16 NOTE — Patient Instructions (Addendum)
It was a pleasure to see you today.  Watch diet and try to get more exercise.  Continue current medications.  Significant improvement in lipids on Crestor.  Return in 6 months for health maintenance exam and fasting labs.

## 2020-06-02 ENCOUNTER — Other Ambulatory Visit: Payer: Self-pay | Admitting: Internal Medicine

## 2020-06-02 DIAGNOSIS — I10 Essential (primary) hypertension: Secondary | ICD-10-CM

## 2020-09-09 ENCOUNTER — Other Ambulatory Visit: Payer: Self-pay | Admitting: Internal Medicine

## 2020-09-30 ENCOUNTER — Other Ambulatory Visit: Payer: PRIVATE HEALTH INSURANCE | Admitting: Internal Medicine

## 2020-09-30 ENCOUNTER — Other Ambulatory Visit: Payer: Self-pay

## 2020-09-30 DIAGNOSIS — Z6833 Body mass index (BMI) 33.0-33.9, adult: Secondary | ICD-10-CM

## 2020-09-30 DIAGNOSIS — E8881 Metabolic syndrome: Secondary | ICD-10-CM

## 2020-09-30 DIAGNOSIS — N6012 Diffuse cystic mastopathy of left breast: Secondary | ICD-10-CM

## 2020-09-30 DIAGNOSIS — F419 Anxiety disorder, unspecified: Secondary | ICD-10-CM

## 2020-09-30 DIAGNOSIS — Z6834 Body mass index (BMI) 34.0-34.9, adult: Secondary | ICD-10-CM

## 2020-09-30 DIAGNOSIS — F32A Depression, unspecified: Secondary | ICD-10-CM

## 2020-09-30 DIAGNOSIS — Z0000 Encounter for general adult medical examination without abnormal findings: Secondary | ICD-10-CM

## 2020-09-30 DIAGNOSIS — R7302 Impaired glucose tolerance (oral): Secondary | ICD-10-CM

## 2020-09-30 DIAGNOSIS — F439 Reaction to severe stress, unspecified: Secondary | ICD-10-CM

## 2020-09-30 DIAGNOSIS — E782 Mixed hyperlipidemia: Secondary | ICD-10-CM

## 2020-09-30 DIAGNOSIS — I10 Essential (primary) hypertension: Secondary | ICD-10-CM

## 2020-10-01 LAB — LIPID PANEL
Cholesterol: 187 mg/dL (ref <200)
HDL: 49 mg/dL — ABNORMAL LOW (ref >=50)
LDL Cholesterol (Calc): 101 mg/dL (calc) — ABNORMAL HIGH
Non-HDL Cholesterol (Calc): 138 mg/dL (calc) — ABNORMAL HIGH (ref <130)
Total CHOL/HDL Ratio: 3.8 (calc) (ref <5.0)
Triglycerides: 245 mg/dL — ABNORMAL HIGH (ref <150)

## 2020-10-01 LAB — COMPLETE METABOLIC PANEL WITH GFR
AG Ratio: 1.9 (calc) (ref 1.0–2.5)
ALT: 18 U/L (ref 6–29)
AST: 17 U/L (ref 10–35)
Albumin: 4.1 g/dL (ref 3.6–5.1)
Alkaline phosphatase (APISO): 92 U/L (ref 37–153)
BUN: 19 mg/dL (ref 7–25)
CO2: 25 mmol/L (ref 20–32)
Calcium: 9.3 mg/dL (ref 8.6–10.4)
Chloride: 105 mmol/L (ref 98–110)
Creat: 0.77 mg/dL (ref 0.50–1.05)
GFR, Est African American: 98 mL/min/{1.73_m2} (ref >=60)
GFR, Est Non African American: 85 mL/min/{1.73_m2} (ref >=60)
Globulin: 2.2 g/dL (calc) (ref 1.9–3.7)
Glucose, Bld: 102 mg/dL — ABNORMAL HIGH (ref 65–99)
Potassium: 4.6 mmol/L (ref 3.5–5.3)
Sodium: 139 mmol/L (ref 135–146)
Total Bilirubin: 0.4 mg/dL (ref 0.2–1.2)
Total Protein: 6.3 g/dL (ref 6.1–8.1)

## 2020-10-01 LAB — CBC WITH DIFFERENTIAL/PLATELET
Absolute Monocytes: 358 cells/uL (ref 200–950)
Basophils Absolute: 39 cells/uL (ref 0–200)
Basophils Relative: 0.6 %
Eosinophils Absolute: 202 cells/uL (ref 15–500)
Eosinophils Relative: 3.1 %
HCT: 43.8 % (ref 35.0–45.0)
Hemoglobin: 14.7 g/dL (ref 11.7–15.5)
Lymphs Abs: 1671 cells/uL (ref 850–3900)
MCH: 28.4 pg (ref 27.0–33.0)
MCHC: 33.6 g/dL (ref 32.0–36.0)
MCV: 84.7 fL (ref 80.0–100.0)
MPV: 10 fL (ref 7.5–12.5)
Monocytes Relative: 5.5 %
Neutro Abs: 4232 cells/uL (ref 1500–7800)
Neutrophils Relative %: 65.1 %
Platelets: 298 10*3/uL (ref 140–400)
RBC: 5.17 10*6/uL — ABNORMAL HIGH (ref 3.80–5.10)
RDW: 13 % (ref 11.0–15.0)
Total Lymphocyte: 25.7 %
WBC: 6.5 10*3/uL (ref 3.8–10.8)

## 2020-10-01 LAB — VITAMIN D 25 HYDROXY (VIT D DEFICIENCY, FRACTURES): Vit D, 25-Hydroxy: 39 ng/mL (ref 30–100)

## 2020-10-01 LAB — HEMOGLOBIN A1C
Hgb A1c MFr Bld: 6.1 % of total Hgb — ABNORMAL HIGH (ref <5.7)
Mean Plasma Glucose: 128 (calc)
eAG (mmol/L): 7.1 (calc)

## 2020-10-01 LAB — MICROALBUMIN / CREATININE URINE RATIO
Creatinine, Urine: 51 mg/dL (ref 20–275)
Microalb Creat Ratio: 6 mcg/mg creat (ref <30)
Microalb, Ur: 0.3 mg/dL

## 2020-10-01 LAB — TSH: TSH: 1.83 mIU/L (ref 0.40–4.50)

## 2020-10-07 ENCOUNTER — Encounter: Payer: Self-pay | Admitting: Internal Medicine

## 2020-10-07 ENCOUNTER — Ambulatory Visit (INDEPENDENT_AMBULATORY_CARE_PROVIDER_SITE_OTHER): Payer: PRIVATE HEALTH INSURANCE | Admitting: Internal Medicine

## 2020-10-07 ENCOUNTER — Other Ambulatory Visit: Payer: Self-pay

## 2020-10-07 VITALS — BP 130/80 | HR 78 | Ht 60.0 in | Wt 186.0 lb

## 2020-10-07 DIAGNOSIS — Z0000 Encounter for general adult medical examination without abnormal findings: Secondary | ICD-10-CM

## 2020-10-07 DIAGNOSIS — Z6836 Body mass index (BMI) 36.0-36.9, adult: Secondary | ICD-10-CM | POA: Diagnosis not present

## 2020-10-07 DIAGNOSIS — E782 Mixed hyperlipidemia: Secondary | ICD-10-CM

## 2020-10-07 DIAGNOSIS — I10 Essential (primary) hypertension: Secondary | ICD-10-CM | POA: Diagnosis not present

## 2020-10-07 DIAGNOSIS — E8881 Metabolic syndrome: Secondary | ICD-10-CM

## 2020-10-07 DIAGNOSIS — F419 Anxiety disorder, unspecified: Secondary | ICD-10-CM | POA: Diagnosis not present

## 2020-10-07 DIAGNOSIS — F32A Depression, unspecified: Secondary | ICD-10-CM

## 2020-10-07 DIAGNOSIS — F439 Reaction to severe stress, unspecified: Secondary | ICD-10-CM

## 2020-10-07 DIAGNOSIS — R7302 Impaired glucose tolerance (oral): Secondary | ICD-10-CM

## 2020-10-07 LAB — POCT URINALYSIS DIPSTICK
Appearance: NEGATIVE
Bilirubin, UA: NEGATIVE
Blood, UA: NEGATIVE
Glucose, UA: NEGATIVE
Ketones, UA: NEGATIVE
Leukocytes, UA: NEGATIVE
Nitrite, UA: NEGATIVE
Odor: NEGATIVE
Protein, UA: NEGATIVE
Spec Grav, UA: 1.01 (ref 1.010–1.025)
Urobilinogen, UA: 0.2 E.U./dL
pH, UA: 6.5 (ref 5.0–8.0)

## 2020-10-07 MED ORDER — METFORMIN HCL 500 MG PO TABS: 500.0000 mg | ORAL_TABLET | Freq: Every day | ORAL | 0 refills | Status: DC

## 2020-10-07 NOTE — Progress Notes (Signed)
   Subjective:    Patient ID: SHASHANA FULLINGTON, female    DOB: Jul 26, 1961, 59 y.o.   MRN: [id number]  HPI  59 year old Female with history of hypertension, hyperlipidemia and impaired glucose tolerance seen for health maintenance exam and evaluation of medical issues.  Has been told by GYN she may have lichen sclerosis.  Remote history of palpitations.  No known drug allergies.  Has seen Dr. Shirlee Latch, Cardiologist, for evaluation of palpitations.  He did 2D echocardiogram and 48-hour Holter monitor which were both normal.    History of vasovagal syncope in 1996.  Received hepatitis B series in 1992.  At 1 point started Zoloft for anxiety and stress at work in 2012 but no longer takes this medication.  In 2012 she had some type of haziness in her left eye centrally.  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  She occasionally gets headaches but does not describe them as migraines.  History of vitamin D deficiency.  Social history: Single.  Never married.  No children.  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  Non-smoker.  Family history: Mother deceased with history of lung cancer.  Father with history of macular degeneration.  1 brother in good health.   Review of Systems weight is increased to 186 pounds and was 176 pounds in August 2019.     Objective:   Physical Exam Blood pressure 130/80 pulse 78 pulse oximetry 98% weight 186 pounds BMI 36.33 height 5 feet 0 inches  Skin is warm and dry.  No cervical adenopathy.  No thyromegaly.  No carotid bruits.  Chest is clear to auscultation without rales or wheezing.  Cardiac exam regular rate and rhythm normal S1 and S2 without murmurs or gallops.  Abdomen soft nondistended without hepatosplenomegaly masses or tenderness.  Extremities without pitting edema.  GYN exam deferred to gynecologist.  Neuro is intact without focal deficits.  Affect thought and judgment are normal.       Assessment & Plan:  Essential hypertension-stable on  current regimen  BMI 36.33-continue to work on diet exercise and weight loss  Hyperlipidemia-triglycerides elevated at 245.  She is on Crestor 5 mg daily but has not been following a strict diet.  Low HDL of 49 and previously was 53 when checked 8 months ago.  Advised to return to exercise regimen.  Impaired glucose tolerance and Hgb A1c he is 6.1%.  Patient is to take Metformin 500 mg daily with breakfast.  Situational stress.  Her mother passed away and she is having to look after her father who has vision issues.  Continues to work full-time.  Plan: She is to return in 6 months for follow-up.  She will start getting more exercise and trying to watch her diet.  Had colonoscopy in 2014.  Gets annual mammogram.  Has had flu vaccine and 2 Covid vaccines.  Needs to get booster.  Tetanus immunization is up-to-date.

## 2020-11-10 ENCOUNTER — Other Ambulatory Visit: Payer: PRIVATE HEALTH INSURANCE

## 2020-11-10 DIAGNOSIS — Z20822 Contact with and (suspected) exposure to covid-19: Secondary | ICD-10-CM

## 2020-11-12 LAB — SARS-COV-2, NAA 2 DAY TAT

## 2020-11-12 LAB — NOVEL CORONAVIRUS, NAA: SARS-CoV-2, NAA: DETECTED — AB

## 2020-11-18 ENCOUNTER — Other Ambulatory Visit: Payer: Self-pay | Admitting: Internal Medicine

## 2020-11-20 NOTE — Patient Instructions (Addendum)
It was a pleasure seeing you today.  Take Metformin 500 mg daily with breakfast for glucose intolerance.  Continue Crestor 5 mg daily.  Increase exercise.  Continue antihypertensive medications.  Return in 6 months.

## 2020-12-15 ENCOUNTER — Other Ambulatory Visit: Payer: Self-pay | Admitting: Internal Medicine

## 2020-12-15 DIAGNOSIS — I10 Essential (primary) hypertension: Secondary | ICD-10-CM

## 2021-01-17 ENCOUNTER — Other Ambulatory Visit: Payer: Self-pay | Admitting: Internal Medicine

## 2021-03-03 ENCOUNTER — Other Ambulatory Visit: Payer: Self-pay | Admitting: Internal Medicine

## 2021-03-24 ENCOUNTER — Other Ambulatory Visit: Payer: PRIVATE HEALTH INSURANCE | Admitting: Internal Medicine

## 2021-03-24 ENCOUNTER — Other Ambulatory Visit: Payer: Self-pay

## 2021-03-24 DIAGNOSIS — R7302 Impaired glucose tolerance (oral): Secondary | ICD-10-CM

## 2021-03-24 DIAGNOSIS — E782 Mixed hyperlipidemia: Secondary | ICD-10-CM

## 2021-03-25 LAB — LIPID PANEL
Cholesterol: 147 mg/dL (ref <200)
HDL: 44 mg/dL — ABNORMAL LOW (ref >=50)
LDL Cholesterol (Calc): 71 mg/dL (calc)
Non-HDL Cholesterol (Calc): 103 mg/dL (calc) (ref <130)
Total CHOL/HDL Ratio: 3.3 (calc) (ref <5.0)
Triglycerides: 230 mg/dL — ABNORMAL HIGH (ref <150)

## 2021-03-25 LAB — HEMOGLOBIN A1C
Hgb A1c MFr Bld: 6 % of total Hgb — ABNORMAL HIGH (ref <5.7)
Mean Plasma Glucose: 126 mg/dL
eAG (mmol/L): 7 mmol/L

## 2021-03-31 ENCOUNTER — Other Ambulatory Visit: Payer: Self-pay

## 2021-03-31 ENCOUNTER — Encounter: Payer: Self-pay | Admitting: Internal Medicine

## 2021-03-31 ENCOUNTER — Ambulatory Visit (INDEPENDENT_AMBULATORY_CARE_PROVIDER_SITE_OTHER): Payer: PRIVATE HEALTH INSURANCE | Admitting: Internal Medicine

## 2021-03-31 VITALS — BP 130/90 | HR 82 | Ht 60.0 in | Wt 183.0 lb

## 2021-03-31 DIAGNOSIS — R7302 Impaired glucose tolerance (oral): Secondary | ICD-10-CM

## 2021-03-31 DIAGNOSIS — F4321 Adjustment disorder with depressed mood: Secondary | ICD-10-CM

## 2021-03-31 DIAGNOSIS — E782 Mixed hyperlipidemia: Secondary | ICD-10-CM | POA: Diagnosis not present

## 2021-03-31 DIAGNOSIS — I10 Essential (primary) hypertension: Secondary | ICD-10-CM

## 2021-03-31 NOTE — Progress Notes (Signed)
   Subjective:    Patient ID: Katherine Nelson, female    DOB: June 15, 1961, 60 y.o.   MRN: [id number]  HPI 60 year old  Female with impaired glucose tolerance and HTN seen for 6 month recheck. Her father passed away recently at [facility] apparently of complications of pneumonia. This has been difficult. Her mother passed away just a few years ago. She is trying to handle the estate. Work is challenging. She is tired. Has planned to take a couple of days off in June.  She has Xanax on hand for anxiety.  Patient had COVID in December 2021.  She had health maintenance exam in November 2021.  History of hyperlipidemia.  Remote history of palpitations.  History of vitamin D deficiency.  Social history: Single.  Never married.  No children.  Works as a Armed forces operational officer.  Mother died with history of lung cancer.  1 brother in good health.  Review of Systems see above     Objective:   Physical Exam Blood pressure 130/90 pulse 82 pulse oximetry 96% weight 183 pounds BMI 35.74  Skin: Warm and dry.  No cervical adenopathy.  No thyromegaly.  Chest is clear to auscultation.  Cardiac exam: Regular rate and rhythm.  No ectopy.  No lower extremity pitting edema.  Affect is sad consistent with grief reaction       Assessment & Plan:  Grief reaction-condolences offered  Essential hypertension stable on current regimen of Lasix and Cozaar  History of dependent edema treated with Lasix 20 mg daily  Impaired glucose tolerance treated with metformin 500 mg daily  History of depression treated with Zoloft  History of anxiety treated with Xanax  Hyperlipidemia treated with Crestor.  Triglycerides are elevated at 230, total cholesterol normal at 147, low HDL of 44 and normal LDL of 71.  She does not want to be on additional medication at this point in time.  She will be due for health maintenance exam in 6 months.  Continue current medications.

## 2021-04-21 ENCOUNTER — Other Ambulatory Visit: Payer: Self-pay | Admitting: Internal Medicine

## 2021-05-15 NOTE — Patient Instructions (Signed)
We are sorry to hear about the loss of her father.  Our condolences go out to you and we will be thinking about you in the upcoming days.  Continue current medications.  Try to watch diet if he can get a bit of exercise.  Health maintenance exam will be due in 6 months.

## 2021-06-02 ENCOUNTER — Other Ambulatory Visit: Payer: Self-pay | Admitting: Internal Medicine

## 2021-06-09 ENCOUNTER — Other Ambulatory Visit: Payer: Self-pay | Admitting: Internal Medicine

## 2021-06-23 ENCOUNTER — Other Ambulatory Visit: Payer: Self-pay | Admitting: Internal Medicine

## 2021-06-23 ENCOUNTER — Telehealth: Payer: Self-pay | Admitting: Internal Medicine

## 2021-06-23 MED ORDER — CLOTRIMAZOLE-BETAMETHASONE 1-0.05 % EX CREA: 1.0000 "application " | TOPICAL_CREAM | Freq: Two times a day (BID) | CUTANEOUS | 2 refills | Status: DC

## 2021-06-23 NOTE — Telephone Encounter (Signed)
I called her back she had been using the wrong one then, can you call the right one in for her?

## 2021-06-23 NOTE — Telephone Encounter (Signed)
Katherine Nelson [phone]  [name] called to see if you would refill medication below she is out, she has vaginal itch, continual problem. Has not been to see Dr Jennette Kettle like you ask yet.   triamcinolone cream (KENALOG) 0.1 %  [id number] - [place], [place] - [address] [place] [address] Phone:  [phone]  Fax:  [phone]

## 2021-06-23 NOTE — Telephone Encounter (Signed)
Done

## 2021-07-16 ENCOUNTER — Other Ambulatory Visit: Payer: Self-pay | Admitting: Internal Medicine

## 2021-07-16 DIAGNOSIS — I10 Essential (primary) hypertension: Secondary | ICD-10-CM

## 2021-09-25 ENCOUNTER — Other Ambulatory Visit: Payer: Self-pay | Admitting: Internal Medicine

## 2021-09-25 DIAGNOSIS — I10 Essential (primary) hypertension: Secondary | ICD-10-CM

## 2021-10-06 ENCOUNTER — Other Ambulatory Visit: Payer: Self-pay

## 2021-10-06 ENCOUNTER — Other Ambulatory Visit: Payer: PRIVATE HEALTH INSURANCE | Admitting: Internal Medicine

## 2021-10-06 DIAGNOSIS — Z1329 Encounter for screening for other suspected endocrine disorder: Secondary | ICD-10-CM

## 2021-10-06 DIAGNOSIS — R7302 Impaired glucose tolerance (oral): Secondary | ICD-10-CM

## 2021-10-06 DIAGNOSIS — E782 Mixed hyperlipidemia: Secondary | ICD-10-CM

## 2021-10-06 DIAGNOSIS — I10 Essential (primary) hypertension: Secondary | ICD-10-CM

## 2021-10-07 LAB — CBC WITH DIFFERENTIAL/PLATELET
Absolute Monocytes: 299 cells/uL (ref 200–950)
Basophils Absolute: 43 cells/uL (ref 0–200)
Basophils Relative: 0.7 %
Eosinophils Absolute: 207 cells/uL (ref 15–500)
Eosinophils Relative: 3.4 %
HCT: 45.1 % — ABNORMAL HIGH (ref 35.0–45.0)
Hemoglobin: 14.6 g/dL (ref 11.7–15.5)
Lymphs Abs: 1629 cells/uL (ref 850–3900)
MCH: 27.4 pg (ref 27.0–33.0)
MCHC: 32.4 g/dL (ref 32.0–36.0)
MCV: 84.8 fL (ref 80.0–100.0)
MPV: 10 fL (ref 7.5–12.5)
Monocytes Relative: 4.9 %
Neutro Abs: 3922 cells/uL (ref 1500–7800)
Neutrophils Relative %: 64.3 %
Platelets: 289 10*3/uL (ref 140–400)
RBC: 5.32 10*6/uL — ABNORMAL HIGH (ref 3.80–5.10)
RDW: 13.3 % (ref 11.0–15.0)
Total Lymphocyte: 26.7 %
WBC: 6.1 10*3/uL (ref 3.8–10.8)

## 2021-10-07 LAB — COMPLETE METABOLIC PANEL WITH GFR
AG Ratio: 1.6 (calc) (ref 1.0–2.5)
ALT: 17 U/L (ref 6–29)
AST: 16 U/L (ref 10–35)
Albumin: 3.9 g/dL (ref 3.6–5.1)
Alkaline phosphatase (APISO): 93 U/L (ref 37–153)
BUN: 17 mg/dL (ref 7–25)
CO2: 26 mmol/L (ref 20–32)
Calcium: 9.6 mg/dL (ref 8.6–10.4)
Chloride: 105 mmol/L (ref 98–110)
Creat: 0.71 mg/dL (ref 0.50–1.05)
Globulin: 2.5 g/dL (calc) (ref 1.9–3.7)
Glucose, Bld: 93 mg/dL (ref 65–99)
Potassium: 4.4 mmol/L (ref 3.5–5.3)
Sodium: 142 mmol/L (ref 135–146)
Total Bilirubin: 0.4 mg/dL (ref 0.2–1.2)
Total Protein: 6.4 g/dL (ref 6.1–8.1)
eGFR: 97 mL/min/{1.73_m2} (ref >=60)

## 2021-10-07 LAB — LIPID PANEL
Cholesterol: 208 mg/dL — ABNORMAL HIGH (ref <200)
HDL: 52 mg/dL (ref >=50)
LDL Cholesterol (Calc): 119 mg/dL (calc) — ABNORMAL HIGH
Non-HDL Cholesterol (Calc): 156 mg/dL (calc) — ABNORMAL HIGH (ref <130)
Total CHOL/HDL Ratio: 4 (calc) (ref <5.0)
Triglycerides: 241 mg/dL — ABNORMAL HIGH (ref <150)

## 2021-10-07 LAB — TSH: TSH: 1.63 mIU/L (ref 0.40–4.50)

## 2021-10-07 LAB — HEMOGLOBIN A1C
Hgb A1c MFr Bld: 6.1 % of total Hgb — ABNORMAL HIGH (ref <5.7)
Mean Plasma Glucose: 128 mg/dL
eAG (mmol/L): 7.1 mmol/L

## 2021-10-20 ENCOUNTER — Ambulatory Visit (INDEPENDENT_AMBULATORY_CARE_PROVIDER_SITE_OTHER): Payer: PRIVATE HEALTH INSURANCE | Admitting: Internal Medicine

## 2021-10-20 ENCOUNTER — Other Ambulatory Visit: Payer: Self-pay

## 2021-10-20 ENCOUNTER — Telehealth: Payer: Self-pay | Admitting: Internal Medicine

## 2021-10-20 VITALS — BP 124/86 | HR 85 | Temp 98.1°F | Resp 16 | Ht 60.0 in | Wt 186.0 lb

## 2021-10-20 DIAGNOSIS — R7302 Impaired glucose tolerance (oral): Secondary | ICD-10-CM

## 2021-10-20 DIAGNOSIS — E782 Mixed hyperlipidemia: Secondary | ICD-10-CM

## 2021-10-20 DIAGNOSIS — Z0000 Encounter for general adult medical examination without abnormal findings: Secondary | ICD-10-CM

## 2021-10-20 DIAGNOSIS — I10 Essential (primary) hypertension: Secondary | ICD-10-CM | POA: Diagnosis not present

## 2021-10-20 MED ORDER — ROSUVASTATIN CALCIUM 10 MG PO TABS: 10.0000 mg | ORAL_TABLET | Freq: Every day | ORAL | 3 refills | Status: DC

## 2021-10-20 MED ORDER — SITAGLIPTIN PHOSPHATE 50 MG PO TABS: 50.0000 mg | ORAL_TABLET | Freq: Every day | ORAL | 1 refills | Status: DC

## 2021-10-20 NOTE — Progress Notes (Signed)
Subjective:    Patient ID: Katherine Nelson, female    DOB: Mar 15, 1961, 60 y.o.   MRN: [id number]  HPI 60 year old Female seen for health maintenance exam and evaluation of medical issues.  Our records indicate only 2 COVID vaccines in 2021.  Has not had flu vaccine and declines at this time.  Mammogram has been ordered.  Patient had colonoscopy by Dr. Kinnie Scales in 2014 and repeat study recommended in 10 years.  Tetanus immunization will be due in 2023.  Patient had COVID-19 in December 2021.  Has been told by GYN she may have lichen sclerosis.  She has a history of hypertension.  Has seen Dr. Sherlie Ban cardiologist for evaluation of palpitations.  He performed 2D echocardiogram and 48-hour Holter monitor both of which which proved to be normal.  History of vasovagal syncope in 1996.  Received Hepatitis B series in 1992.  In 2012 she had some type of haziness in her left eye centrally.  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  She occasionally gets headaches but does not describe them as migraine type headaches.  History of vitamin D deficiency.  History of anxiety and situational stress at work in 2012 for which Zoloft was prescribed.  She no longer takes this medication.  Social history: Single, never married, no children.  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  Non-smoker.  Parents are deceased.  Mother deceased with history of lung cancer.  Father had history of macular degeneration and chronic kidney disease developed Covid-19 pneumonia and succumbed to respiratory failure in Feb 2022.  1 brother in good health.  Patient has history of elevated Hgb AIC. Recent value 6.1%. Was started on Januvia in December 2022.It was expensive. Does not have good pharmacy plan with her insurance coverage.Have started her today on Metformin.Will have AIC only in March without OV. Is on Losartan for HTN. Has significant Hypertriglyceridemia. May improve if glucose gets under better control. I want  her to try Crestor 10 mg daily. Is at risk for heart disease with glucose intolerance and hyperlipidemia.     Review of Systems see above- no new complaints     Objective:   Physical Exam VS reviewed. BP 124/86; BMI 36.33, weight 186 pounds.  Skin is warm and dry.  No cervical adenopathy or thyromegaly.  No carotid bruits.  Chest is clear to auscultation.  Cardiac exam: Regular rate and rhythm without ectopy or murmurs.  Abdomen is soft nondistended without hepatosplenomegaly masses or tenderness.  GYN exam deferred to GYN physician.  No lower extremity pitting edema.  Neurological exam is intact without gross focal deficits.     Assessment & Plan:  This has been a hard time for her as she lost her father early in 2022.  She has impaired glucose tolerance.  Januvia was expensive.  She  can try metformin instead.  Endocrine referral is recommended. They may can help with her meds. With regard to hypertension, she will continue with losartan.  I would like for her to be on Crestor 10 mg daily for significant hypertriglyceridemia which is likely related to impaired glucose tolerance.  She will return in March for lab only.  She is to monitor her Accu-Cheks, work on diet and exercise.  Try to lose some weight.  She says she does not have good health insurance coverage and prefers to return in 1 year not in 6 months. Referral to Endocrinology is option for diabetic management. They may be able to  get her some assistance with meds.

## 2021-10-20 NOTE — Telephone Encounter (Signed)
Katherine Nelson [phone]  [name] called back to say she did not pick up below medication, it was  $500 for 30 day supply $1000 for 90 day supply  Do you want her to go back on metformin and try to get weight under control.?  I did tell her there was a $5.00 coupon on the Internet she could try, and there was some other ones out there too, so she was going to check into them.

## 2021-11-24 ENCOUNTER — Other Ambulatory Visit: Payer: Self-pay | Admitting: Internal Medicine

## 2021-12-02 ENCOUNTER — Encounter: Payer: Self-pay | Admitting: Internal Medicine

## 2021-12-02 NOTE — Patient Instructions (Addendum)
Return in March for lab only. Please take Januvia and Crestor as prescribed. RTC in one year. Continue meds for hypertension.  Please try to work on diet, exercise and weight loss.

## 2021-12-06 ENCOUNTER — Other Ambulatory Visit: Payer: Self-pay | Admitting: Internal Medicine

## 2021-12-06 DIAGNOSIS — N6489 Other specified disorders of breast: Secondary | ICD-10-CM

## 2021-12-22 ENCOUNTER — Encounter: Payer: Self-pay | Admitting: Internal Medicine

## 2022-01-11 ENCOUNTER — Ambulatory Visit
Admission: RE | Admit: 2022-01-11 | Discharge: 2022-01-11 | Disposition: A | Discharge status: Home / Self Care | Payer: PRIVATE HEALTH INSURANCE | Source: Ambulatory Visit | Attending: Internal Medicine | Admitting: Internal Medicine

## 2022-01-11 DIAGNOSIS — N6489 Other specified disorders of breast: Secondary | ICD-10-CM

## 2022-01-26 ENCOUNTER — Other Ambulatory Visit: Payer: PRIVATE HEALTH INSURANCE | Admitting: Internal Medicine

## 2022-02-02 ENCOUNTER — Other Ambulatory Visit: Payer: PRIVATE HEALTH INSURANCE | Admitting: Internal Medicine

## 2022-02-02 ENCOUNTER — Other Ambulatory Visit: Payer: Self-pay

## 2022-02-02 ENCOUNTER — Other Ambulatory Visit: Payer: PRIVATE HEALTH INSURANCE

## 2022-02-03 LAB — HEMOGLOBIN A1C
Hgb A1c MFr Bld: 6.1 % of total Hgb — ABNORMAL HIGH (ref <5.7)
Mean Plasma Glucose: 128 mg/dL
eAG (mmol/L): 7.1 mmol/L

## 2022-07-03 ENCOUNTER — Other Ambulatory Visit: Payer: Self-pay | Admitting: Internal Medicine

## 2022-07-03 DIAGNOSIS — I10 Essential (primary) hypertension: Secondary | ICD-10-CM

## 2022-10-18 ENCOUNTER — Other Ambulatory Visit: Payer: Self-pay | Admitting: Internal Medicine

## 2022-10-18 DIAGNOSIS — I10 Essential (primary) hypertension: Secondary | ICD-10-CM

## 2022-10-19 ENCOUNTER — Other Ambulatory Visit: Payer: Self-pay

## 2022-10-19 DIAGNOSIS — R7302 Impaired glucose tolerance (oral): Secondary | ICD-10-CM

## 2022-10-19 DIAGNOSIS — E782 Mixed hyperlipidemia: Secondary | ICD-10-CM

## 2022-10-19 DIAGNOSIS — I10 Essential (primary) hypertension: Secondary | ICD-10-CM

## 2022-10-19 DIAGNOSIS — R5383 Other fatigue: Secondary | ICD-10-CM

## 2022-10-20 LAB — CBC WITH DIFFERENTIAL/PLATELET
Absolute Monocytes: 423 cells/uL (ref 200–950)
Basophils Absolute: 51 cells/uL (ref 0–200)
Basophils Relative: 0.7 %
Eosinophils Absolute: 212 cells/uL (ref 15–500)
Eosinophils Relative: 2.9 %
HCT: 45.2 % — ABNORMAL HIGH (ref 35.0–45.0)
Hemoglobin: 15.1 g/dL (ref 11.7–15.5)
Lymphs Abs: 1701 cells/uL (ref 850–3900)
MCH: 27.6 pg (ref 27.0–33.0)
MCHC: 33.4 g/dL (ref 32.0–36.0)
MCV: 82.5 fL (ref 80.0–100.0)
MPV: 10.2 fL (ref 7.5–12.5)
Monocytes Relative: 5.8 %
Neutro Abs: 4913 cells/uL (ref 1500–7800)
Neutrophils Relative %: 67.3 %
Platelets: 303 10*3/uL (ref 140–400)
RBC: 5.48 10*6/uL — ABNORMAL HIGH (ref 3.80–5.10)
RDW: 13.3 % (ref 11.0–15.0)
Total Lymphocyte: 23.3 %
WBC: 7.3 10*3/uL (ref 3.8–10.8)

## 2022-10-20 LAB — LIPID PANEL
Cholesterol: 207 mg/dL — ABNORMAL HIGH (ref <200)
HDL: 49 mg/dL — ABNORMAL LOW (ref >=50)
LDL Cholesterol (Calc): 112 mg/dL (calc) — ABNORMAL HIGH
Non-HDL Cholesterol (Calc): 158 mg/dL (calc) — ABNORMAL HIGH (ref <130)
Total CHOL/HDL Ratio: 4.2 (calc) (ref <5.0)
Triglycerides: 345 mg/dL — ABNORMAL HIGH (ref <150)

## 2022-10-20 LAB — COMPLETE METABOLIC PANEL WITH GFR
AG Ratio: 1.7 (calc) (ref 1.0–2.5)
ALT: 23 U/L (ref 6–29)
AST: 19 U/L (ref 10–35)
Albumin: 4.1 g/dL (ref 3.6–5.1)
Alkaline phosphatase (APISO): 109 U/L (ref 37–153)
BUN: 19 mg/dL (ref 7–25)
CO2: 25 mmol/L (ref 20–32)
Calcium: 9.3 mg/dL (ref 8.6–10.4)
Chloride: 103 mmol/L (ref 98–110)
Creat: 0.71 mg/dL (ref 0.50–1.05)
Globulin: 2.4 g/dL (calc) (ref 1.9–3.7)
Glucose, Bld: 101 mg/dL — ABNORMAL HIGH (ref 65–99)
Potassium: 4.6 mmol/L (ref 3.5–5.3)
Sodium: 140 mmol/L (ref 135–146)
Total Bilirubin: 0.5 mg/dL (ref 0.2–1.2)
Total Protein: 6.5 g/dL (ref 6.1–8.1)
eGFR: 97 mL/min/{1.73_m2} (ref >=60)

## 2022-10-20 LAB — HEMOGLOBIN A1C
Hgb A1c MFr Bld: 6.4 % of total Hgb — ABNORMAL HIGH (ref <5.7)
Mean Plasma Glucose: 137 mg/dL
eAG (mmol/L): 7.6 mmol/L

## 2022-10-20 LAB — TSH: TSH: 3.42 mIU/L (ref 0.40–4.50)

## 2022-10-26 ENCOUNTER — Encounter: Payer: Self-pay | Admitting: Internal Medicine

## 2022-10-26 ENCOUNTER — Ambulatory Visit (INDEPENDENT_AMBULATORY_CARE_PROVIDER_SITE_OTHER): Payer: Self-pay | Admitting: Internal Medicine

## 2022-10-26 VITALS — BP 120/78 | HR 81 | Temp 98.5°F | Ht 60.0 in | Wt 187.1 lb

## 2022-10-26 DIAGNOSIS — Z0000 Encounter for general adult medical examination without abnormal findings: Secondary | ICD-10-CM

## 2022-10-26 LAB — POCT URINALYSIS DIPSTICK
Bilirubin, UA: NEGATIVE
Blood, UA: NEGATIVE
Glucose, UA: NEGATIVE
Ketones, UA: NEGATIVE
Leukocytes, UA: NEGATIVE
Nitrite, UA: NEGATIVE
Protein, UA: NEGATIVE
Spec Grav, UA: 1.01 (ref 1.010–1.025)
Urobilinogen, UA: 0.2 E.U./dL
pH, UA: 6.5 (ref 5.0–8.0)

## 2022-10-26 MED ORDER — METFORMIN HCL 500 MG PO TABS: 500.0000 mg | ORAL_TABLET | Freq: Two times a day (BID) | ORAL | 3 refills | Status: DC

## 2022-10-26 MED ORDER — ROSUVASTATIN CALCIUM 20 MG PO TABS: 20.0000 mg | ORAL_TABLET | Freq: Every day | ORAL | 3 refills | Status: DC

## 2022-10-26 NOTE — Progress Notes (Signed)
Subjective:    Patient ID: Katherine Nelson, female    DOB: 02/08/1961, 61 y.o.   MRN: [id number]  HPI 61 year old Female seen for health maintenance exam and evaluation of medical issues.  Patient previously worked as a Armed forces operational officer but has taken some time away from the job for herself and is currently not working.  Vaccines discussed.  She prefers not to be revaccinated for COVID-19.  Have recommended flu vaccine with history of glucose intolerance and her age.  Needs tetanus immunization update sometime in the future and certainly if she has an injury.  It was last done in 2013.  She had colonoscopy in 2014 and repeat study recommended September 2024.  Had mammogram in February 2023 which was normal.  She is seeing Dr. Lucien Mons, cardiologist in the past for palpitations.  He performed a 2D echocardiogram and a 48 Holter monitor both of which were normal.  History of vasovagal syncope in 1996.  Received hepatitis B series in 1992.  In 2012 she had some haziness in her left eye centrally.  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  She occasionally gets headaches but not often and does not describe them as classical migraine headaches.  She does have a history of vitamin D deficiency, history of anxiety and situational stress for which Zoloft has been prescribed but she no longer takes this medication.  Social history: Single, never married, no children.  Non-smoker  Family history: Parents are deceased.  Mother had history of lung cancer.  Father had macular degeneration, chronic kidney disease and developed COVID-19 pneumonia and succumbed to respiratory failure in February 2022.  1 brother in good health.  We have tried her on Januvia for diabetes control in the past but it was expensive.  We also prescribed metformin.  She takes losartan for hypertension.  She was recently prescribed Lasix with this visit for dependent edema and fluid retention if needed.  Had COVID-19 in 2021.  Recent  fasting labs showed total cholesterol of 207, HDL low at 49, triglycerides 345 and LDL cholesterol of 112.  Sent in prescription for Crestor 20 mg daily and recommend follow-up in 6 months.  I refilled metformin 500 mg twice daily but I do think it is going to take other medication to control her diabetes.  She would like to try diet and exercise first.  I think she should follow-up in 6 months.  Review of Systems no chest pain, shortness of breath, abdominal issues.  Situational stress improved when she left her job.  She is taking some time for herself now.     Objective:   Physical Exam Her blood pressure today is excellent at 120/78, pulse 81, temperature 98.5 degrees pulse oximetry 98% weight 187 pounds 1.9 ounces BMI 36.54 Skin: Warm and dry.  No cervical adenopathy.  No thyromegaly.  TMs and pharynx are clear.  Chest clear.  Cardiac exam: Regular rate and rhythm.  Breasts are without masses.  Abdomen is soft nondistended without hepatosplenomegaly masses or tenderness.  No lower extremity pitting edema.  Affect thought and judgment are normal.      Assessment & Plan:  BMI 36.54-needs to work on diet exercise and weight loss.  BMI needs to be less than 30 and closer to 25.  Type 2 diabetes mellitus-does not want to be on medication other than metformin but likely needs other medication and Endocrinology referral would be best for her at this point.  Meds such as Wegovy would help  with her weight loss.  Hemoglobin A1c 6.4%.  At one point prescribed Januvia for her but it was not affordable she says.  History of situational stress-have prescribed in the past Zoloft 50 mg daily but she is currently not taking this.  Essential hypertension  Dependent edema-I prescribed Lasix 20 mg daily to take on a as needed basis.  Plan: Kidney functions are normal.  TSH is normal.  Her CBC is normal.  Dipstick UA is normal.  Fasting glucose is not bad at 101.  Liver functions are normal.  She is going to  give diet and exercise and try.  I would like to see her again in 3- 6 months.  Essential hypertension-treated with losartan  Hyperlipidemia-mixed but has significant hypertriglyceridemia.  Have prescribed Crestor 20 mg daily and needs follow-up in a few months.

## 2022-10-26 NOTE — Patient Instructions (Addendum)
Try Metformin 500 mg twice daily. Hold Januvia for now.  Try Crestor 20 mg daily for hyperlipidemia.  Take Lasix sparingly for dependent edema.  Continue losartan from 50 mg daily.  Return in 3- 6 months.

## 2022-12-04 ENCOUNTER — Other Ambulatory Visit: Payer: Self-pay

## 2022-12-04 DIAGNOSIS — R7302 Impaired glucose tolerance (oral): Secondary | ICD-10-CM

## 2022-12-05 LAB — HEMOGLOBIN A1C
Hgb A1c MFr Bld: 6.4 % of total Hgb — ABNORMAL HIGH (ref <5.7)
Mean Plasma Glucose: 137 mg/dL
eAG (mmol/L): 7.6 mmol/L

## 2022-12-06 ENCOUNTER — Other Ambulatory Visit: Payer: Self-pay

## 2022-12-07 ENCOUNTER — Ambulatory Visit (INDEPENDENT_AMBULATORY_CARE_PROVIDER_SITE_OTHER): Payer: Self-pay | Admitting: Internal Medicine

## 2022-12-07 ENCOUNTER — Encounter: Payer: Self-pay | Admitting: Internal Medicine

## 2022-12-07 VITALS — BP 124/76 | HR 85 | Temp 98.7°F | Ht 60.0 in | Wt 185.0 lb

## 2022-12-07 DIAGNOSIS — E782 Mixed hyperlipidemia: Secondary | ICD-10-CM

## 2022-12-07 DIAGNOSIS — E119 Type 2 diabetes mellitus without complications: Secondary | ICD-10-CM

## 2022-12-07 DIAGNOSIS — I10 Essential (primary) hypertension: Secondary | ICD-10-CM

## 2022-12-07 DIAGNOSIS — Z6836 Body mass index (BMI) 36.0-36.9, adult: Secondary | ICD-10-CM

## 2022-12-07 NOTE — Progress Notes (Signed)
   Subjective:    Patient ID: Katherine Nelson, female    DOB: 31-Aug-1961, 62 y.o.   MRN: [id number]  HPI 62 year old Female seen for Diabetes follow up.  She was here December 8 for health maintenance exam.  At that time her weight was 187.19 pounds and is now 185 pounds.  Her hemoglobin A1c was 6.4% in early December and is now still 6.4%.  She was placed on metformin on December 8.  In 2022 was prescribed Januvia but expense was an issue.  Remains on losartan and Lasix for hypertension.    Review of Systems see above     Objective:   Physical Exam  Blood pressure excellent at 124/76, pulse 85 regular, temperature 98.7 degrees, pulse oximetry 99%, weight 185 pounds, BMI 36.13  Skin: Warm and dry.  No cervical adenopathy or thyromegaly.  Chest clear.  Cardiac exam regular rate and rhythm.      Assessment & Plan:  Essential hypertension  Mixed hyperlipidemia  BMI 36.13  Plan: Patient really does not want to be on injectable therapy for diabetes.  She prefers oral agents.  Januvia has been cost prohibitive but I think we can get her enrolled in the [place] assistance program.  Will certainly try to do that.  She will continue with Crestor 20 mg daily as she had significant hypertriglyceridemia  in early December at 345.  Total cholesterol was 207 and HDL cholesterol was 49.  Lipids were not checked with this visit due to expense but need to be checked in a few months.  We understand she meets financial criteria for Januvia patient assistance program through [employer].

## 2022-12-07 NOTE — Patient Instructions (Addendum)
Try to get [place] program for pt. She currently has no health insurance. Continue with diet and exercise as well as metformin. Also, refer to Endocrine for diabetic management. Advise her what we learn from Assistance program.  Recommend follow-up with fasting lipid panel and hemoglobin A1c in 3 months.  She will work on diet exercise and weight loss.

## 2022-12-20 IMAGING — MG DIGITAL DIAGNOSTIC BILAT W/ TOMO W/ CAD
8 series · 8 of 24 positions shown · non-contrast
Comparison: Previous exam(s).

CLINICAL DATA: Follow-up status post benign LEFT breast biopsy in
3531. Additional history of benign LEFT breast biopsy in 5088.

EXAM:
DIGITAL DIAGNOSTIC BILATERAL MAMMOGRAM WITH TOMOSYNTHESIS AND CAD
TECHNIQUE: Bilateral digital diagnostic mammography and breast tomosynthesis
was performed. The images were evaluated with computer-aided
detection.

[R CC synth-2D]
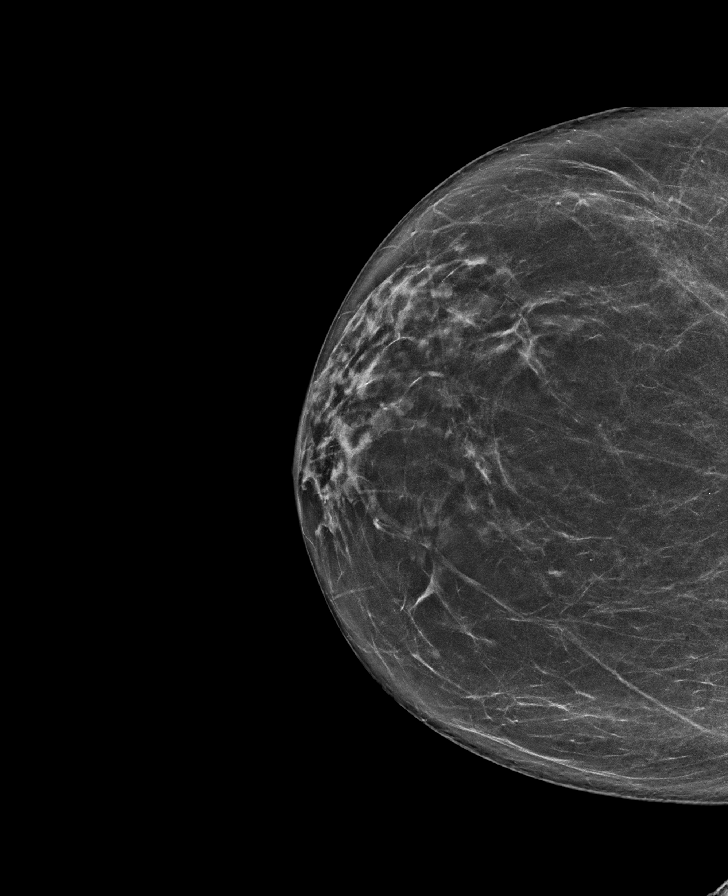

[L MLO synth-2D]
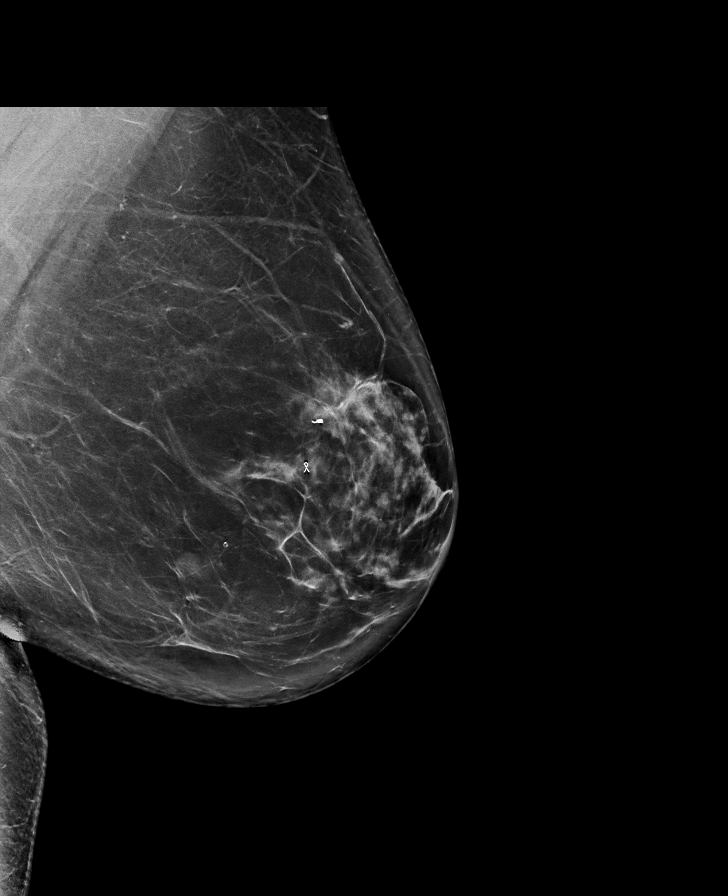

[L CC synth-2D]
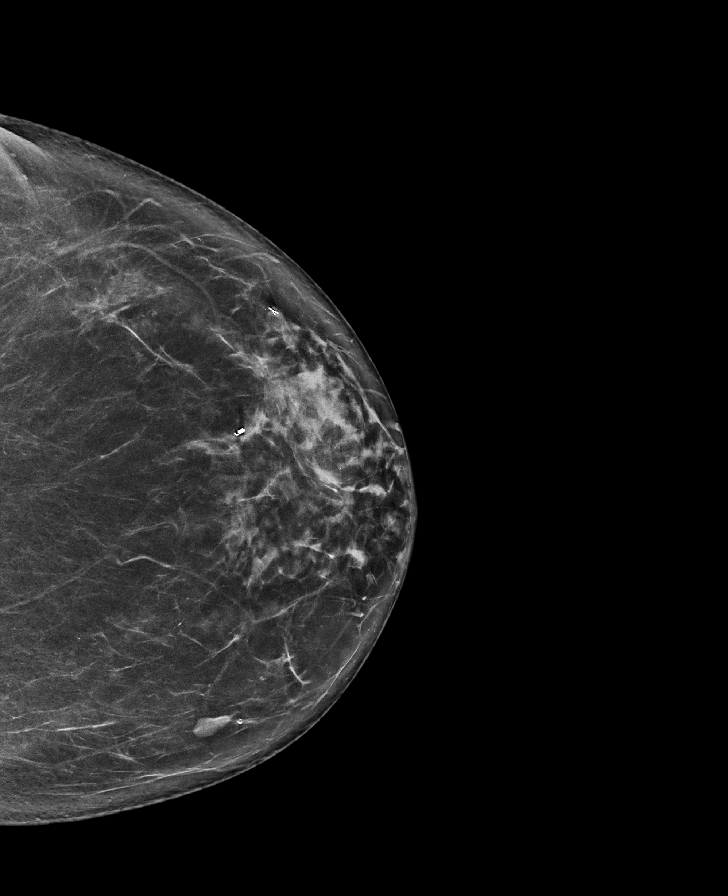

[R MLO synth-2D]
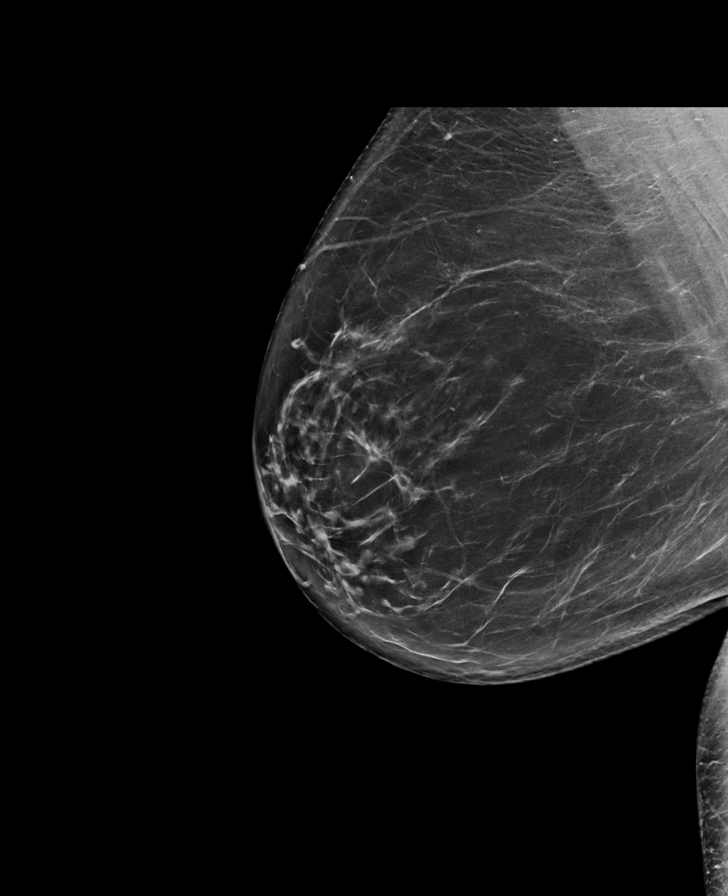

[R MLO tomo · tomo slice 47/92.0]
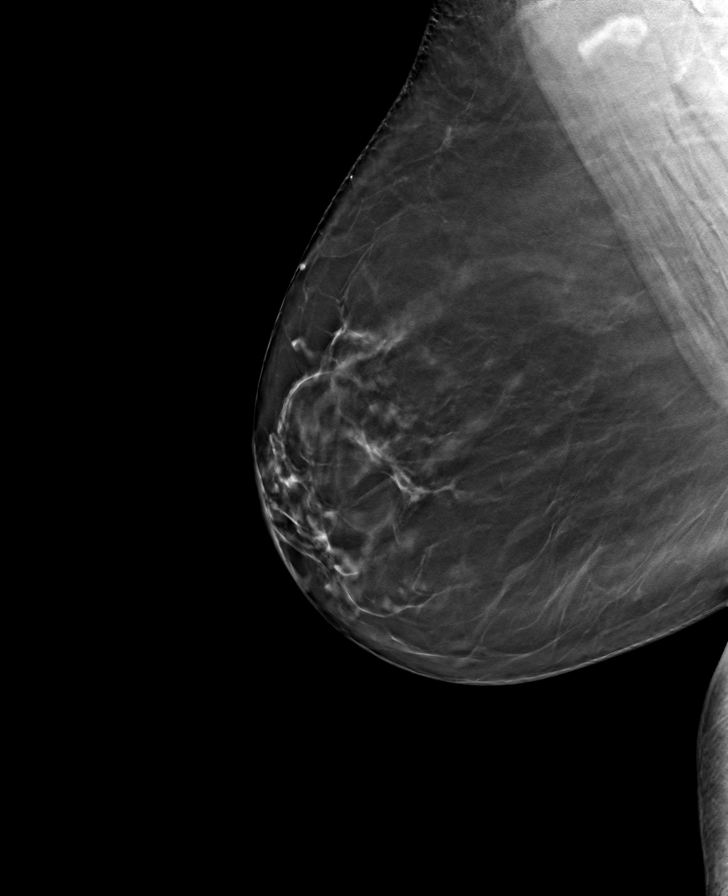

[L CC tomo · tomo slice 41/80.0]
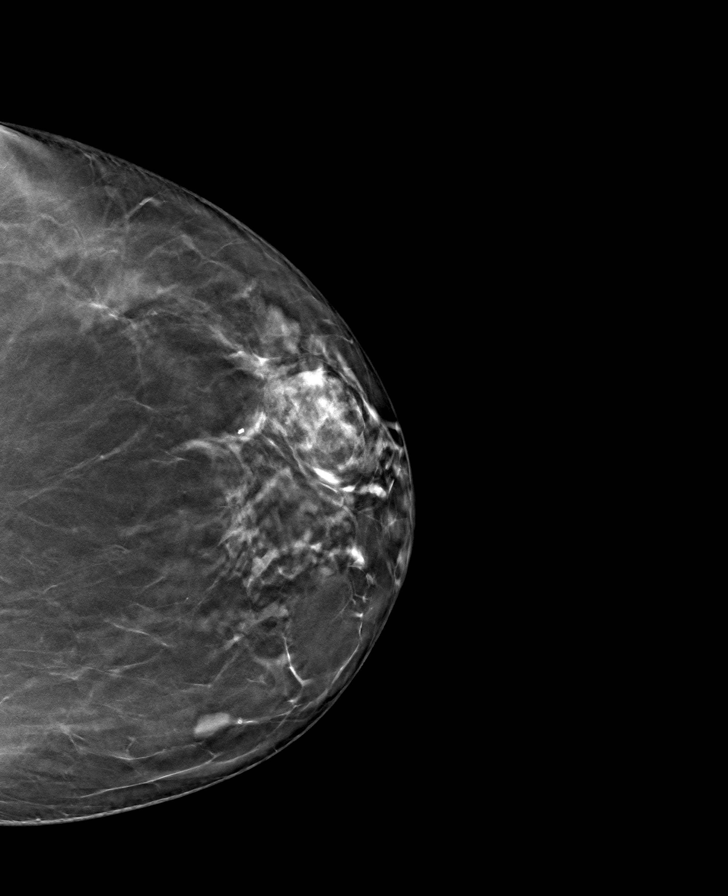

[L MLO tomo · tomo slice 47/94.0]
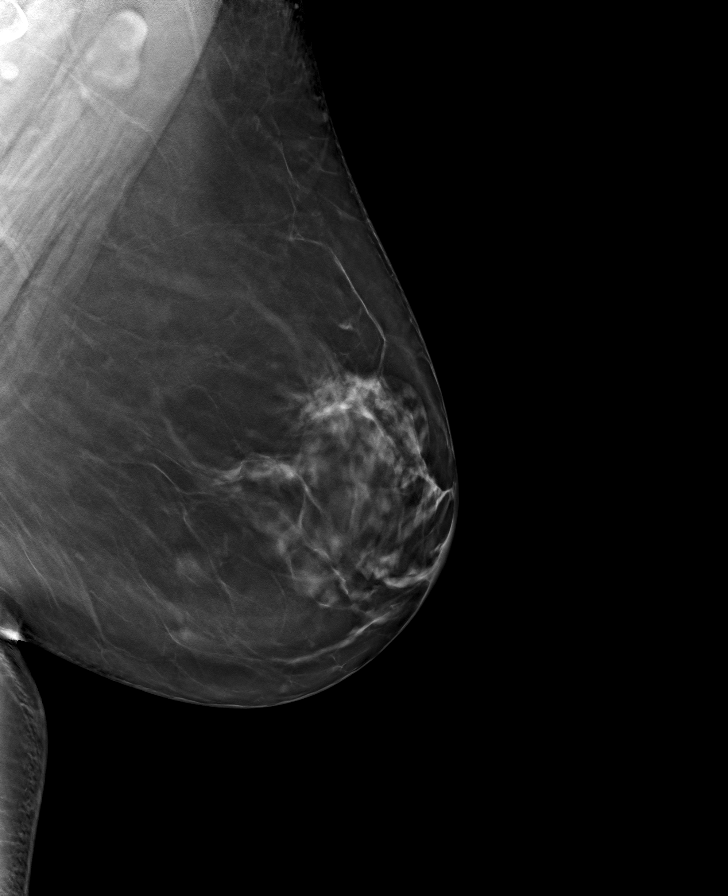

[R CC tomo · tomo slice 37/74.0]
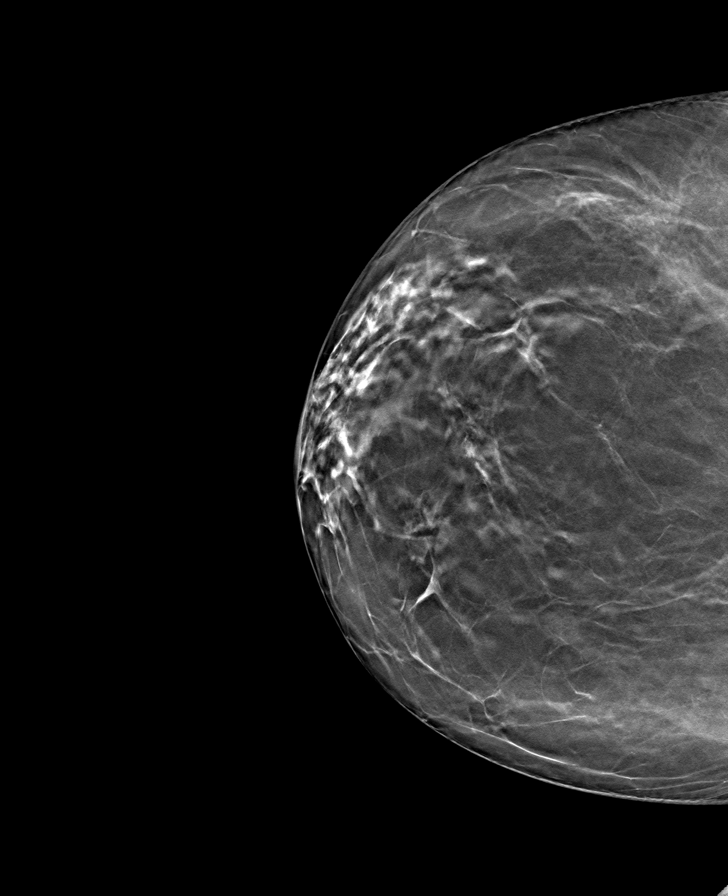

[8 of 24 positions shown; findings below may reference images not displayed]

ACR Breast Density Category b: There are scattered areas of
fibroglandular density.
FINDINGS: Biopsy site markers within the LEFT breast are stable in position,
corresponding to the earlier benign biopsy sites. There are no new
dominant masses, suspicious calcifications or secondary signs of
malignancy within either breast.
IMPRESSION: No evidence of malignancy within either breast.

Patient may return to routine annual bilateral screening mammogram
schedule.

RECOMMENDATION:
Screening mammogram in one year.(Code:[id number])

I have discussed the findings and recommendations with the patient.
If applicable, a reminder letter will be sent to the patient
regarding the next appointment.

BI-RADS CATEGORY  2: Benign.

## 2023-01-09 ENCOUNTER — Other Ambulatory Visit: Payer: Self-pay | Admitting: Pharmacist

## 2023-01-09 NOTE — Patient Instructions (Signed)
Hi Jovie,  It was a pleasure speaking with you today. As we discussed, continue with healthy food and exercise lifestyle, and check blood sugars a few times per week - once in morning, and once about 2hr after a meal.   We will touch base in 1 month over the phone and see how those numbers look. I am starting the process to get the [place] application sent your way as well.   Below are some helpful tips about checking blood sugar:  Check your blood sugars twice daily:  1) Fasting, first thing in the morning before breakfast and  2) 2 hours after your largest meal.   For a goal A1c of less than 7%, goal fasting readings are less than 130 and goal 2 hour after meal readings are less than 180.    Take care, Luana Shu, PharmD Clinical Pharmacist [facility] [phone]

## 2023-01-09 NOTE — Progress Notes (Signed)
01/09/2023 Name: Katherine Nelson MRN: [id number] DOB: October 13, 1961  Chief Complaint  Patient presents with   Diabetes   Medication Assistance    Katherine Nelson is a 62 y.o. year old female who presented for a telephone visit.   They were referred to the pharmacist by their PCP for assistance in managing diabetes and medication access.    Subjective:  Care Team: Primary Care Provider: Elby Showers, MD ;    Medication Access/Adherence  Current Pharmacy:  [facility] [address] [place] - [place] [address] [place] [address] [place] [address] Phone: [phone] Fax: [phone]   Patient reports affordability concerns with their medications: Yes januvia. Using Goodrx for others.  Patient reports access/transportation concerns to their pharmacy: No  Patient reports adherence concerns with their medications:  No     Diabetes:  Current medications: metformin 526m BID  Current glucose readings: not currently checking, but wants to start  Patient denies hypoglycemic s/sx including dizziness, shakiness, sweating.  Patient denies hyperglycemic symptoms including polyuria, polydipsia, polyphagia, nocturia, neuropathy, blurred vision.  Current meal patterns: weight watchers, considers to be a healthy eater and mindful about food choices    Objective:  Lab Results  Component Value Date   HGBA1C 6.4 (H) 12/04/2022    Lab Results  Component Value Date   CREATININE 0.71 10/19/2022   BUN 19 10/19/2022   NA 140 10/19/2022   K 4.6 10/19/2022   CL 103 10/19/2022   CO2 25 10/19/2022    Lab Results  Component Value Date   CHOL 207 (H) 10/19/2022   HDL 49 (L) 10/19/2022   LDLCALC 112 (H) 10/19/2022   TRIG 345 (H) 10/19/2022   CHOLHDL 4.2 10/19/2022    Medications Reviewed Today     Reviewed by Katherine Nelson Katherine Nelson(Pharmacist) on 01/09/23 at 0910  Med List Status: <None>   Medication Order Taking? Sig Documenting Provider Last Dose Status  Informant  ALPRAZolam (XANAX) 0.25 MG tablet [id number] Take 1 tablet (0.25 mg total) by mouth 2 (two) times daily as needed for anxiety.  Patient not taking: Reported on 10/26/2022   BElby Showers MD Not Taking Active   Biotin 1 MG CAPS [id number] Take by mouth. [provider] Taking Active   Cholecalciferol (VITAMIN D) 2000 UNITS CAPS [id number] Take by mouth. [provider] Taking Active   clotrimazole-betamethasone (LOTRISONE) cream [id number] Apply 1 application topically 2 (two) times daily. BElby Showers MD Taking Active   Coenzyme Q10 (CO Q 10) 100 MG CAPS [id number] Take by mouth.  Patient not taking: Reported on 01/09/2023   [provider] Not Taking Active   furosemide (LASIX) 20 MG tablet [id number] Take 1 tablet by mouth once daily Baxley, MCresenciano Lick MD Taking Active   losartan (COZAAR) 50 MG tablet [id number] Take 1 tablet by mouth once daily Baxley, MCresenciano Lick MD Taking Active   metFORMIN (GLUCOPHAGE) 500 MG tablet [id number] Take 1 tablet (500 mg total) by mouth 2 (two) times daily with a meal. Baxley, MCresenciano Lick MD Taking Active   Multiple Vitamins-Minerals (PRESERVISION AREDS) CAPS [id number] Take 1 capsule by mouth daily. [provider] Taking Active   Probiotic Product (PROBIOTIC-10 PO) [id number] Take by mouth.  Patient not taking: Reported on 01/09/2023   [provider] Not Taking Active   rosuvastatin (CRESTOR) 20 MG tablet [id number] Take 1 tablet (20 mg total) by mouth daily. Baxley,  Katherine Lick, MD Taking Active   sitaGLIPtin (JANUVIA) 50 MG tablet [id number] No Take 1 tablet (50 mg total) by mouth daily.  Patient not taking: Reported on 10/26/2022   Katherine Showers, MD Not Taking Active   triamcinolone cream (KENALOG) 0.1 % [id number] Yes Apply to rash three times a day. Katherine Showers, MD Taking Active               Assessment/Plan:   Diabetes: - Currently controlled, patient is proactive with  early stage/prediabetes, on metformin, implementing life changes, and recovering from stressful season of grief/loss. PCP wishes to initiate [place] via patient assistance. - Reviewed long term cardiovascular and renal outcomes of uncontrolled blood sugar - Reviewed goal A1c, goal fasting, and goal 2 hour post prandial glucose - Reviewed dietary modifications including priotizing lean proteins, vegetables, fruit and choosing carbs/sweets in moderation.  - Recommend to continue metformin and continue implementing lifestyle changes - Recommend to check glucose in AM, as well as 2hr after meal for fasting + postprandial readings to discuss at next visit - Meets financial criteria for [place] patient assistance program through [employer]. Will collaborate with provider, CPhT, and patient to pursue assistance.     Follow Up Plan: 1 month   Katherine Nelson, PharmD Clinical Pharmacist [facility]

## 2023-01-14 ENCOUNTER — Other Ambulatory Visit (HOSPITAL_COMMUNITY): Payer: Self-pay

## 2023-01-15 NOTE — Progress Notes (Signed)
Mailed PAP application FOR JANUVIA '50MG'$  (MERCK)  to patient home. WE will fax provider portion once I receive the pt portion.  THANKS YOU! Sandre [facility] [employer] Patient Advocate

## 2023-01-31 ENCOUNTER — Telehealth: Payer: Self-pay | Admitting: Pharmacist

## 2023-01-31 ENCOUNTER — Other Ambulatory Visit: Payer: Self-pay | Admitting: Pharmacist

## 2023-01-31 NOTE — Progress Notes (Signed)
Attempted to contact patient for scheduled appointment for medication management. Left HIPAA compliant message for patient to return my call at their convenience.   Larinda Buttery, PharmD Clinical Pharmacist [facility] [phone]

## 2023-02-03 ENCOUNTER — Other Ambulatory Visit: Payer: Self-pay | Admitting: Internal Medicine

## 2023-02-03 DIAGNOSIS — I10 Essential (primary) hypertension: Secondary | ICD-10-CM

## 2023-03-27 ENCOUNTER — Telehealth: Payer: Self-pay | Admitting: Pharmacist

## 2023-03-27 NOTE — Progress Notes (Signed)
Attempted to contact patient for medication management after being lost to follow up. Left HIPAA compliant message for patient to return my call at their convenience.   Lynnda Shields, PharmD, BCPS Clinical Pharmacist [facility]

## 2023-05-22 ENCOUNTER — Other Ambulatory Visit: Payer: Self-pay | Admitting: Internal Medicine

## 2023-05-22 DIAGNOSIS — I10 Essential (primary) hypertension: Secondary | ICD-10-CM

## 2023-05-30 ENCOUNTER — Telehealth: Payer: Self-pay | Admitting: Internal Medicine

## 2023-05-30 NOTE — Telephone Encounter (Signed)
scheduled

## 2023-05-30 NOTE — Telephone Encounter (Signed)
Katherine Nelson [phone]  Katherine Nelson called to say she has been having some digestive issues for the last few months like first it was constipation and gas, then she went camping and now she is having spells of watery diarrhea and diarrhea. Would like to come in and be seen.

## 2023-06-03 ENCOUNTER — Encounter: Payer: Self-pay | Admitting: Internal Medicine

## 2023-06-03 ENCOUNTER — Ambulatory Visit (INDEPENDENT_AMBULATORY_CARE_PROVIDER_SITE_OTHER): Payer: Self-pay | Admitting: Internal Medicine

## 2023-06-03 VITALS — BP 124/76 | HR 85 | Temp 98.7°F | Ht 60.0 in | Wt 168.4 lb

## 2023-06-03 DIAGNOSIS — K580 Irritable bowel syndrome with diarrhea: Secondary | ICD-10-CM

## 2023-06-03 DIAGNOSIS — R197 Diarrhea, unspecified: Secondary | ICD-10-CM

## 2023-06-03 DIAGNOSIS — E119 Type 2 diabetes mellitus without complications: Secondary | ICD-10-CM

## 2023-06-03 MED ORDER — METRONIDAZOLE 500 MG PO TABS: 500.0000 mg | ORAL_TABLET | Freq: Two times a day (BID) | ORAL | 0 refills | Status: AC

## 2023-06-03 MED ORDER — CILIDINIUM-CHLORDIAZEPOXIDE 2.5-5 MG PO CAPS: ORAL_CAPSULE | ORAL | 1 refills | Status: AC

## 2023-06-03 NOTE — Progress Notes (Signed)
Patient Care Team: [name], MD as PCP - General  Visit Date: 06/03/23  Subjective:    Patient ID: Katherine Nelson , Female   DOB: [birth year], 62 y.o.    MRN: [id number]   62 y.o. Female presents today for abdominal bloating, burping, diarrhea for the past few days. She was camping at the time. Symptoms have improved. She has been monitoring her diet and exercising regularly. Denies nausea, vomiting. History of hemorrhoids. These IBS-D flare-ups are longstanding and at one point she believed it was stress-related but now is not sure. Colonoscopy in 2014 with Dr. Kinnie Scales was normal. Due for repeat.  History of Type 2 diabetes mellitus treated with metformin 500 mg twice daily. Requesting an A1c check.   No past medical history on file.   Family History  Problem Relation Age of Onset   Cancer Mother    Breast cancer Paternal Aunt        12s    Social History   Social History Narrative   Not on file      Review of Systems  Constitutional:  Negative for fever and malaise/fatigue.  HENT:  Negative for congestion.   Eyes:  Negative for blurred vision.  Respiratory:  Negative for cough and shortness of breath.   Cardiovascular:  Negative for chest pain, palpitations and leg swelling.  Gastrointestinal:  Positive for abdominal pain (Gas) and diarrhea. Negative for nausea and vomiting.  Musculoskeletal:  Negative for back pain.  Skin:  Negative for rash.  Neurological:  Negative for loss of consciousness and headaches.        Objective:   Vitals: BP 124/76   Pulse 85   Temp 98.7 F (37.1 C) (Temporal)   Ht 5' (1.524 m)   Wt 168 lb 6.4 oz (76.4 kg)   LMP 09/19/2014 Comment: first since year and half ago  SpO2 97%   BMI 32.89 kg/m    Physical Exam Vitals and nursing note reviewed.  Constitutional:      General: She is not in acute distress.    Appearance: Normal appearance. She is not toxic-appearing.  HENT:     Head: Normocephalic and atraumatic.   Pulmonary:     Effort: Pulmonary effort is normal.  Abdominal:     General: Abdomen is flat. Bowel sounds are normal. There is no distension.     Palpations: Abdomen is soft. There is no hepatomegaly, splenomegaly or mass.     Tenderness: There is no abdominal tenderness.     Comments: Abdomen slightly obese  Skin:    General: Skin is warm and dry.  Neurological:     Mental Status: She is alert and oriented to person, place, and time. Mental status is at baseline.  Psychiatric:        Mood and Affect: Mood normal.        Behavior: Behavior normal.        Thought Content: Thought content normal.        Judgment: Judgment normal.       Results:   Studies obtained and personally reviewed by me:   Labs:       Component Value Date/Time   NA 140 10/19/2022 0926   K 4.6 10/19/2022 0926   CL 103 10/19/2022 0926   CO2 25 10/19/2022 0926   GLUCOSE 101 (H) 10/19/2022 0926   BUN 19 10/19/2022 0926   CREATININE 0.71 10/19/2022 0926   CALCIUM 9.3 10/19/2022 0926   PROT 6.5 10/19/2022 0926  ALBUMIN 4.0 05/31/2017 1119   AST 19 10/19/2022 0926   ALT 23 10/19/2022 0926   ALKPHOS 79 05/31/2017 1119   BILITOT 0.5 10/19/2022 0926   GFRNONAA 85 09/30/2020 0916   GFRAA 98 09/30/2020 0916     Lab Results  Component Value Date   WBC 7.3 10/19/2022   HGB 15.1 10/19/2022   HCT 45.2 (H) 10/19/2022   MCV 82.5 10/19/2022   PLT 303 10/19/2022    Lab Results  Component Value Date   CHOL 207 (H) 10/19/2022   HDL 49 (L) 10/19/2022   LDLCALC 112 (H) 10/19/2022   TRIG 345 (H) 10/19/2022   CHOLHDL 4.2 10/19/2022    Lab Results  Component Value Date   HGBA1C 6.4 (H) 12/04/2022     Lab Results  Component Value Date   TSH 3.42 10/19/2022      Assessment & Plan:   IBS-D flare-up: prescribed Flagyl 500 mg twice daily for 7 days, Librax 5-2.5 mg thirty minutes before eating as needed. Contact us if symptoms do not improve.  Type 2 diabetes mellitus: treated with metformin 500  mg twice daily. Ordered A1c.    I,Alexander Ruley,acting as a Neurosurgeon for [name], MD.,have documented all relevant documentation on the behalf of [name], MD,as directed by  [name], MD while in the presence of [name], MD.   ***

## 2023-06-04 DIAGNOSIS — K589 Irritable bowel syndrome without diarrhea: Secondary | ICD-10-CM | POA: Insufficient documentation

## 2023-06-04 LAB — HEMOGLOBIN A1C
Hgb A1c MFr Bld: 6.1 % of total Hgb — ABNORMAL HIGH (ref <5.7)
Mean Plasma Glucose: 128 mg/dL
eAG (mmol/L): 7.1 mmol/L

## 2023-06-04 NOTE — Patient Instructions (Signed)
Take Flagyl 500 mg twice daily for 7 days to see if we can alter bowel flora with irritable bowel syndrome. Then may try Librax 30 min before meals or a car trip up to twice daily as needed for irritable bowel symptoms. Colonoscopy due in near future.

## 2023-08-21 ENCOUNTER — Other Ambulatory Visit: Payer: Self-pay | Admitting: Family

## 2023-08-21 DIAGNOSIS — I10 Essential (primary) hypertension: Secondary | ICD-10-CM

## 2023-08-22 ENCOUNTER — Other Ambulatory Visit: Payer: Self-pay | Admitting: Internal Medicine

## 2023-08-22 DIAGNOSIS — I10 Essential (primary) hypertension: Secondary | ICD-10-CM

## 2023-08-23 ENCOUNTER — Other Ambulatory Visit: Payer: Self-pay | Admitting: Internal Medicine

## 2023-08-23 DIAGNOSIS — Z1211 Encounter for screening for malignant neoplasm of colon: Secondary | ICD-10-CM

## 2023-08-23 DIAGNOSIS — Z1212 Encounter for screening for malignant neoplasm of rectum: Secondary | ICD-10-CM

## 2023-08-23 MED ORDER — LOSARTAN POTASSIUM 50 MG PO TABS: 50.0000 mg | ORAL_TABLET | Freq: Every day | ORAL | 0 refills | Status: DC

## 2023-08-23 MED ORDER — FUROSEMIDE 20 MG PO TABS: 20.0000 mg | ORAL_TABLET | Freq: Every day | ORAL | 0 refills | Status: DC

## 2024-01-02 ENCOUNTER — Other Ambulatory Visit: Payer: Self-pay | Admitting: Internal Medicine

## 2024-01-02 DIAGNOSIS — I10 Essential (primary) hypertension: Secondary | ICD-10-CM

## 2024-01-02 MED ORDER — LOSARTAN POTASSIUM 50 MG PO TABS: 50.0000 mg | ORAL_TABLET | Freq: Every day | ORAL | 1 refills | Status: DC

## 2024-01-02 MED ORDER — FUROSEMIDE 20 MG PO TABS: 20.0000 mg | ORAL_TABLET | Freq: Every day | ORAL | 1 refills | Status: DC

## 2024-02-03 ENCOUNTER — Other Ambulatory Visit: Payer: Self-pay | Admitting: Internal Medicine

## 2024-02-11 ENCOUNTER — Ambulatory Visit (INDEPENDENT_AMBULATORY_CARE_PROVIDER_SITE_OTHER): Payer: Self-pay | Admitting: Internal Medicine

## 2024-02-11 ENCOUNTER — Encounter: Payer: Self-pay | Admitting: Internal Medicine

## 2024-02-11 VITALS — BP 130/88 | HR 81 | Temp 98.7°F | Resp 12 | Ht 60.0 in | Wt 171.4 lb

## 2024-02-11 DIAGNOSIS — E782 Mixed hyperlipidemia: Secondary | ICD-10-CM

## 2024-02-11 DIAGNOSIS — Z6833 Body mass index (BMI) 33.0-33.9, adult: Secondary | ICD-10-CM

## 2024-02-11 DIAGNOSIS — E119 Type 2 diabetes mellitus without complications: Secondary | ICD-10-CM

## 2024-02-11 DIAGNOSIS — I10 Essential (primary) hypertension: Secondary | ICD-10-CM

## 2024-02-11 DIAGNOSIS — E7439 Other disorders of intestinal carbohydrate absorption: Secondary | ICD-10-CM

## 2024-02-11 MED ORDER — LOSARTAN POTASSIUM 100 MG PO TABS: 100.0000 mg | ORAL_TABLET | Freq: Every day | ORAL | 0 refills | Status: DC

## 2024-02-11 NOTE — Progress Notes (Shared)
 Patient Care Team: [name], MD as PCP - General  Visit Date: 02/11/24  Subjective:   Chief Complaint  Patient presents with   Follow-up  Patient Nelson Katherine, [place] DOB:05/23/1961,63 y.o. VWU:[id number]   63 y.o. Female presents today for Diabetes; Hyperlipidemia; Hypertension; BMI follow-up. Last seen in this office 05/2023, last annual visit was 10/2022.   History of Hypertension treated with Losartan 50 mg daily and Lasix 20 mg daily. She says that she has noticed some elevations in her blood pressure, with readings around 140s/80s, more specifically 137/84 and this morning before leaving was 145/88; Blood Pressure: normotensive today in-office at 130/88. Has had some associated headaches.   History of Hyperlipidemia treated with Rosuvastatin 20 mg daily. 10/2022 Lipid Panel w/ Cholesterol 207; LDL 112; Triglycerides 345; and HDL 49.   History of Diabetes Mellitus, type II treated with Metformin 500 mg, prescribed twice daily but says that she is taking once daily due to GI issues.  05/2023 HgbA1c 6.1. BMI 33.47, 171 pounds 6.4 oz today. She says that she has been exercising more, and tracking her sugars which have been good. Says that she is due to have her eye exam.   No past medical history on file.  No Known Allergies  Family History  Problem Relation Age of Onset   Cancer Mother    Breast cancer Paternal Aunt        62s   Social History   Social History Narrative   Not on file   Review of Systems  Constitutional:  Negative for fever and malaise/fatigue.  HENT:  Negative for congestion.   Eyes:  Negative for blurred vision.  Respiratory:  Negative for cough and shortness of breath.   Cardiovascular:  Negative for chest pain, palpitations and leg swelling.       (+) Elevated BPs  Gastrointestinal:  Negative for vomiting.  Musculoskeletal:  Negative for back pain.  Skin:  Negative for rash.  Neurological:  Positive for headaches. Negative for loss of  consciousness.     Objective:  Vitals: BP 130/88 (BP Location: Left Arm, Patient Position: Sitting)   Pulse 81   Temp 98.7 F (37.1 C) (Temporal)   Resp 12   Ht 5' (1.524 m)   Wt 171 lb 6.4 oz (77.7 kg)   LMP 09/19/2014 Comment: first since year and half ago  SpO2 97%   BMI 33.47 kg/m   Physical Exam Vitals and nursing note reviewed.  Constitutional:      General: She is not in acute distress.    Appearance: Normal appearance. She is not toxic-appearing.  HENT:     Head: Normocephalic and atraumatic.  Cardiovascular:     Rate and Rhythm: Normal rate and regular rhythm. No extrasystoles are present.    Pulses: Normal pulses.     Heart sounds: Normal heart sounds. No murmur heard.    No friction rub. No gallop.  Pulmonary:     Effort: Pulmonary effort is normal. No respiratory distress.     Breath sounds: Normal breath sounds. No wheezing or rales.  Musculoskeletal:     Right lower leg: Edema (trace) present.     Left lower leg: Edema (trace) present.  Skin:    General: Skin is warm and dry.  Neurological:     Mental Status: She is alert and oriented to person, place, and time. Mental status is at baseline.  Psychiatric:        Mood and Affect: Mood normal.  Behavior: Behavior normal.        Thought Content: Thought content normal.        Judgment: Judgment normal.     Results:  Studies Obtained And Personally Reviewed By Me: Labs:     Component Value Date/Time   NA 140 10/19/2022 0926   K 4.6 10/19/2022 0926   CL 103 10/19/2022 0926   CO2 25 10/19/2022 0926   GLUCOSE 101 (H) 10/19/2022 0926   BUN 19 10/19/2022 0926   CREATININE 0.71 10/19/2022 0926   CALCIUM 9.3 10/19/2022 0926   PROT 6.5 10/19/2022 0926   ALBUMIN 4.0 05/31/2017 1119   AST 19 10/19/2022 0926   ALT 23 10/19/2022 0926   ALKPHOS 79 05/31/2017 1119   BILITOT 0.5 10/19/2022 0926   GFRNONAA 85 09/30/2020 0916   GFRAA 98 09/30/2020 0916    Lab Results  Component Value Date   WBC 7.3  10/19/2022   HGB 15.1 10/19/2022   HCT 45.2 (H) 10/19/2022   MCV 82.5 10/19/2022   PLT 303 10/19/2022   Lab Results  Component Value Date   CHOL 207 (H) 10/19/2022   HDL 49 (L) 10/19/2022   LDLCALC 112 (H) 10/19/2022   TRIG 345 (H) 10/19/2022   CHOLHDL 4.2 10/19/2022   Lab Results  Component Value Date   HGBA1C 6.1 (H) 06/03/2023    Lab Results  Component Value Date   TSH 3.42 10/19/2022    Assessment & Plan:   Orders Placed This Encounter  Procedures   Hemoglobin A1c   COMPLETE METABOLIC PANEL WITH GFR   CBC with Differential/Platelet   Lipid panel   TSH   Microalbumin / creatinine urine ratio  Hypertension treated with Losartan 50 mg daily and Lasix 20 mg daily. She says that she has noticed some elevations in her blood pressure, with readings around 140s/80s, more specifically 137/84 and this morning before leaving was 145/88; Blood Pressure: normotensive today in-office at 130/88. Has had some associated headaches. Increasing Losartan to 100 mg daily.   Hyperlipidemia treated with Rosuvastatin 20 mg daily. 10/2022 Lipid Panel w/ Cholesterol 207; LDL 112; Triglycerides 345; and HDL 49.   Diabetes Mellitus, type II treated with Metformin 500 mg, prescribed twice daily but says that she is taking once daily due to GI issues.  05/2023 HgbA1c 6.1. Due to have her eye exam.  BMI 33.47, 171 pounds 6.4 oz today.    I,Emily Lagle,acting as a Neurosurgeon for [name], MD.,have documented all relevant documentation on the behalf of [name], MD,as directed by  [name], MD while in the presence of [name], MD.   ***

## 2024-02-11 NOTE — Patient Instructions (Signed)
 Labs drawn and results pending. Will advise.

## 2024-02-12 ENCOUNTER — Encounter: Payer: Self-pay | Admitting: Internal Medicine

## 2024-02-12 LAB — CBC WITH DIFFERENTIAL/PLATELET
Absolute Lymphocytes: 1632 {cells}/uL (ref 850–3900)
Absolute Monocytes: 340 {cells}/uL (ref 200–950)
Basophils Absolute: 27 {cells}/uL (ref 0–200)
Basophils Relative: 0.4 %
Eosinophils Absolute: 109 {cells}/uL (ref 15–500)
Eosinophils Relative: 1.6 %
HCT: 47.1 % — ABNORMAL HIGH (ref 35.0–45.0)
Hemoglobin: 15.8 g/dL — ABNORMAL HIGH (ref 11.7–15.5)
MCH: 28.1 pg (ref 27.0–33.0)
MCHC: 33.5 g/dL (ref 32.0–36.0)
MCV: 83.8 fL (ref 80.0–100.0)
MPV: 10.1 fL (ref 7.5–12.5)
Monocytes Relative: 5 %
Neutro Abs: 4692 {cells}/uL (ref 1500–7800)
Neutrophils Relative %: 69 %
Platelets: 300 10*3/uL (ref 140–400)
RBC: 5.62 10*6/uL — ABNORMAL HIGH (ref 3.80–5.10)
RDW: 13.4 % (ref 11.0–15.0)
Total Lymphocyte: 24 %
WBC: 6.8 10*3/uL (ref 3.8–10.8)

## 2024-02-12 LAB — COMPLETE METABOLIC PANEL WITH GFR
AG Ratio: 1.7 (calc) (ref 1.0–2.5)
ALT: 16 U/L (ref 6–29)
AST: 18 U/L (ref 10–35)
Albumin: 4.3 g/dL (ref 3.6–5.1)
Alkaline phosphatase (APISO): 97 U/L (ref 37–153)
BUN: 19 mg/dL (ref 7–25)
CO2: 31 mmol/L (ref 20–32)
Calcium: 9.9 mg/dL (ref 8.6–10.4)
Chloride: 101 mmol/L (ref 98–110)
Creat: 0.71 mg/dL (ref 0.50–1.05)
Globulin: 2.6 g/dL (ref 1.9–3.7)
Glucose, Bld: 95 mg/dL (ref 65–99)
Potassium: 3.9 mmol/L (ref 3.5–5.3)
Sodium: 139 mmol/L (ref 135–146)
Total Bilirubin: 0.6 mg/dL (ref 0.2–1.2)
Total Protein: 6.9 g/dL (ref 6.1–8.1)

## 2024-02-12 LAB — HEMOGLOBIN A1C
Hgb A1c MFr Bld: 6.2 %{Hb} — ABNORMAL HIGH (ref <5.7)
Mean Plasma Glucose: 131 mg/dL
eAG (mmol/L): 7.3 mmol/L

## 2024-02-12 LAB — MICROALBUMIN / CREATININE URINE RATIO
Creatinine, Urine: 118 mg/dL (ref 20–275)
Microalb Creat Ratio: 7 mg/g{creat} (ref <30)
Microalb, Ur: 0.8 mg/dL

## 2024-02-12 LAB — LIPID PANEL
Cholesterol: 312 mg/dL — ABNORMAL HIGH (ref <200)
HDL: 54 mg/dL (ref >=50)
LDL Cholesterol (Calc): 212 mg/dL — ABNORMAL HIGH
Non-HDL Cholesterol (Calc): 258 mg/dL — ABNORMAL HIGH (ref <130)
Total CHOL/HDL Ratio: 5.8 (calc) — ABNORMAL HIGH (ref <5.0)
Triglycerides: 257 mg/dL — ABNORMAL HIGH (ref <150)

## 2024-02-12 LAB — TSH: TSH: 2.45 m[IU]/L (ref 0.40–4.50)

## 2024-02-14 ENCOUNTER — Encounter: Payer: Self-pay | Admitting: Internal Medicine

## 2024-02-14 ENCOUNTER — Telehealth: Payer: Self-pay | Admitting: Internal Medicine

## 2024-02-14 DIAGNOSIS — E1169 Type 2 diabetes mellitus with other specified complication: Secondary | ICD-10-CM

## 2024-02-14 MED ORDER — METFORMIN HCL 500 MG PO TABS: 500.0000 mg | ORAL_TABLET | Freq: Two times a day (BID) | ORAL | 3 refills | Status: AC

## 2024-02-14 NOTE — Telephone Encounter (Signed)
 Katherine Nelson [phone]  1.Roxanna called to get a refill on below medication.  metFORMIN (GLUCOPHAGE) 500 MG tablet  [facility] [address] [place] - [address] [place] [address] Phone: [phone]  Fax: [phone]     2. She also wants to know if she could try a different medication other than rosuvastatin (CRESTOR) 20 MG tablet   something like a simvastatin.  3. She would also like to get the CT Cardiac Scoring test in [place].Marland Kitchen

## 2024-02-21 MED ORDER — ROSUVASTATIN CALCIUM 20 MG PO TABS: 20.0000 mg | ORAL_TABLET | Freq: Every day | ORAL | 3 refills | Status: AC

## 2024-02-21 NOTE — Addendum Note (Signed)
 Addended by: Thelma Barge D on: 02/21/2024 04:00 PM   Modules accepted: Orders

## 2024-02-21 NOTE — Telephone Encounter (Signed)
 Patient notified.  She will stay on the 20mg  of Crestor until we recheck again.

## 2024-02-21 NOTE — Telephone Encounter (Signed)
 Left message on machine to call back

## 2024-02-25 ENCOUNTER — Ambulatory Visit (HOSPITAL_COMMUNITY)
Admission: RE | Admit: 2024-02-25 | Discharge: 2024-02-25 | Disposition: A | Discharge status: Home / Self Care | Payer: Self-pay | Source: Ambulatory Visit | Attending: Internal Medicine | Admitting: Internal Medicine

## 2024-02-25 DIAGNOSIS — E785 Hyperlipidemia, unspecified: Secondary | ICD-10-CM | POA: Insufficient documentation

## 2024-02-25 DIAGNOSIS — E1169 Type 2 diabetes mellitus with other specified complication: Secondary | ICD-10-CM | POA: Insufficient documentation

## 2024-06-25 ENCOUNTER — Other Ambulatory Visit: Payer: Self-pay | Admitting: Internal Medicine

## 2024-06-25 DIAGNOSIS — I10 Essential (primary) hypertension: Secondary | ICD-10-CM

## 2024-06-29 ENCOUNTER — Other Ambulatory Visit: Payer: Self-pay | Admitting: Internal Medicine

## 2024-09-23 ENCOUNTER — Other Ambulatory Visit: Payer: Self-pay | Admitting: Internal Medicine

## 2024-12-20 ENCOUNTER — Other Ambulatory Visit: Payer: Self-pay | Admitting: Internal Medicine

## 2024-12-20 DIAGNOSIS — I10 Essential (primary) hypertension: Secondary | ICD-10-CM
